# Patient Record
Sex: Female | Born: 1960 | Race: White | Hispanic: No | Marital: Married | Smoking: Never smoker
Health system: Southern US, Community
[De-identification: ages and names within clinical notes are randomized; demographics above are authoritative.]

## PROBLEM LIST (undated history)

## (undated) DIAGNOSIS — G4733 Obstructive sleep apnea (adult) (pediatric): Secondary | ICD-10-CM

## (undated) DIAGNOSIS — E669 Obesity, unspecified: Secondary | ICD-10-CM

## (undated) DIAGNOSIS — E785 Hyperlipidemia, unspecified: Secondary | ICD-10-CM

## (undated) DIAGNOSIS — R739 Hyperglycemia, unspecified: Secondary | ICD-10-CM

## (undated) DIAGNOSIS — R931 Abnormal findings on diagnostic imaging of heart and coronary circulation: Secondary | ICD-10-CM

## (undated) DIAGNOSIS — G4730 Sleep apnea, unspecified: Secondary | ICD-10-CM

## (undated) HISTORY — DX: Sleep apnea, unspecified: G47.30

## (undated) HISTORY — DX: Abnormal findings on diagnostic imaging of heart and coronary circulation: R93.1

## (undated) HISTORY — DX: Hyperlipidemia, unspecified: E78.5

## (undated) HISTORY — DX: Obesity, unspecified: E66.9

## (undated) HISTORY — DX: Hyperglycemia, unspecified: R73.9

## (undated) HISTORY — PX: OTHER SURGICAL HISTORY: SHX169

## (undated) HISTORY — DX: Obstructive sleep apnea (adult) (pediatric): G47.33

---

## 2017-02-17 NOTE — Progress Notes (Signed)
Cardiology Office Note   Date:  02/20/2017   ID:  Hailey Sexton, DOB 1960/12/07, MRN [id number]  PCP:  Patient, No Pcp Per  Cardiologist:   No primary care provider on file. Referring:  None  Chief Complaint  Patient presents with  . Chest Pain      History of Present Illness: Hailey Sexton is a 57 y.o. female who presents for evaluation of chest pain and shortness of breath.  She has no past cardiac history although her mother died in 12/24/2022 following bypass surgery.  Her mother was in her 35s but is otherwise been relatively well.  She had peripheral vascular problems since she was in her 66s.  Her brother who was healthy actually was found recently to have coronary disease in his early 82s and he required bypass.  She had a treadmill test years ago but never had any heart problems by herself.  However, she has been noticing some episodes of chest discomfort.  She feels some pins pricking in her chest at times.  She might notice this with activity such as walking up an incline or carrying something and walking.  Is not completely reproducible and is somewhat vague.  She might have this at rest.  She also might have some shortness of breath where she feels like she cannot get a good breath.  She denies any neck or arm discomfort.  She is not having any palpitations, presyncope or syncope.  She is not having any edema.  However, she is gained 30 pounds over time and she does think that she is deconditioned.  She lives in [place] and works there but her husband lives in [place] and they travel back and forth.  Apparently life in [place] is somewhat distressing for her.   PMH:  None  PSH: Sphincterotomy   Current Outpatient Medications  Medication Sig Dispense Refill  . diazepam (VALIUM) 5 MG tablet As needed     No current facility-administered medications for this visit.     Allergies:   Patient has no known allergies.    Social History:  The patient  reports that  has  never smoked. she has never used smokeless tobacco.   Family History:  The patient's family history includes Heart attack in her maternal grandfather; Heart attack (age of onset: 28) in her father; Heart disease in her mother; Other in her brother; Peripheral Artery Disease (age of onset: 66) in her mother; Stroke in her maternal grandmother.   (See above)   ROS:  Please see the history of present illness.   Otherwise, review of systems are positive for none.   All other systems are reviewed and negative.    PHYSICAL EXAM: VS:  BP (!) 148/102   Pulse 76   Ht 5\' 2"  (1.575 m)   Wt 213 lb (96.6 kg)   BMI 38.96 kg/m  , BMI Body mass index is 38.96 kg/m. GENERAL:  Well appearing HEENT:  Pupils equal round and reactive, fundi not visualized, oral mucosa unremarkable NECK:  No jugular venous distention, waveform within normal limits, carotid upstroke brisk and symmetric, no bruits, no thyromegaly LYMPHATICS:  No cervical, inguinal adenopathy LUNGS:  Clear to auscultation bilaterally BACK:  No CVA tenderness CHEST:  Unremarkable HEART:  PMI not displaced or sustained,S1 and S2 within normal limits, no S3, no S4, no clicks, no rubs, no murmurs ABD:  Flat, positive bowel sounds normal in frequency in pitch, no bruits, no rebound, no guarding, no midline  pulsatile mass, no hepatomegaly, no splenomegaly EXT:  2 plus pulses throughout, no edema, no cyanosis no clubbing SKIN:  No rashes no nodules NEURO:  Cranial nerves II through XII grossly intact, motor grossly intact throughout PSYCH:  Cognitively intact, oriented to person place and time    EKG:  EKG is ordered today. The ekg ordered today demonstrates sinus rhythm, rate 76, axis within normal limits, intervals within normal limits, no acute ST-T wave changes.   Recent Labs: No results found for requested labs within last 8760 hours.    Lipid Panel No results found for: CHOL, TRIG, HDL, CHOLHDL, VLDL, LDLCALC, LDLDIRECT    Wt  Readings from Last 3 Encounters:  02/20/17 213 lb (96.6 kg)      Other studies Reviewed: Additional studies/ records that were reviewed today include: None. Review of the above records demonstrates:  Please see elsewhere in the note.     ASSESSMENT AND PLAN:  CHEST PAIN: The pain is somewhat atypical but she has a significant family history.  I am going to order a coronary calcium score. I will bring the patient back for a POET (Plain Old Exercise Test). This will allow me to screen for obstructive coronary disease, risk stratify and very importantly provide a prescription for exercise.  SOB: She does describe some breathlessness.  I am going to check a CBC.  RISK REDUCTI ON:  She needs screening with a basic metabolic profile and A1c.  I will check a lipid profile.    Current medicines are reviewed at length with the patient today.  The patient does not have concerns regarding medicines.  The following changes have been made:  no change  Labs/ tests ordered today include:   Orders Placed This Encounter  Procedures  . CT CARDIAC SCORING  . CBC  . Basic metabolic panel  . Lipid panel  . HgB A1c  . Exercise Tolerance Test  . EKG 12-Lead     Disposition:   FU with based on the results of the bove.     Signed, Rollene RotundaJames Hicks Feick, MD  02/20/2017 10:41 AM    [facility]

## 2017-02-20 ENCOUNTER — Ambulatory Visit (INDEPENDENT_AMBULATORY_CARE_PROVIDER_SITE_OTHER): Payer: 59 | Admitting: Cardiology

## 2017-02-20 ENCOUNTER — Ambulatory Visit
Admission: RE | Admit: 2017-02-20 | Discharge: 2017-02-20 | Disposition: A | Discharge status: Home / Self Care | Payer: Self-pay | Source: Ambulatory Visit | Attending: Cardiology | Admitting: Cardiology

## 2017-02-20 ENCOUNTER — Encounter: Payer: Self-pay | Admitting: Cardiology

## 2017-02-20 VITALS — BP 148/102 | HR 76 | Ht 62.0 in | Wt 213.0 lb

## 2017-02-20 DIAGNOSIS — Z131 Encounter for screening for diabetes mellitus: Secondary | ICD-10-CM

## 2017-02-20 DIAGNOSIS — Z13220 Encounter for screening for lipoid disorders: Secondary | ICD-10-CM | POA: Diagnosis not present

## 2017-02-20 DIAGNOSIS — R079 Chest pain, unspecified: Secondary | ICD-10-CM

## 2017-02-20 DIAGNOSIS — R0602 Shortness of breath: Secondary | ICD-10-CM

## 2017-02-20 DIAGNOSIS — Z79899 Other long term (current) drug therapy: Secondary | ICD-10-CM | POA: Diagnosis not present

## 2017-02-20 NOTE — Patient Instructions (Signed)
Medication Instructions:  Continue current medications  If you need a refill on your cardiac medications before your next appointment, please call your pharmacy.  Labwork: CBC, BMP, Fasting Lipids and Hgb A1C HERE IN OUR OFFICE AT [facility]  Take the provided lab slips for you to take with you to the lab for you blood draw.   You will need to fast. DO NOT EAT OR DRINK PAST MIDNIGHT.   You may go to any [facility] lab that is convenient for you however, we do have a lab in our office that is able to assist you. You do NOT need an appointment for our lab. Once in our office lobby there is a podium to the right of the check-in desk where you are to sign-in and ring a doorbell to alert us you are here. Lab is open Monday-Friday from 8:00am to 4:00pm; and is closed for lunch from 12:45p-1:45pm   Testing/Procedures: Your physician has requested that you have an exercise tolerance test. For further information please visit [email or web address]. Please also follow instruction sheet, as given.  Your physician has requested that you have a Coronary Calcium Score Test. This test is done at our [facility] Office.  Follow-Up: Your physician wants you to follow-up in: After Test.    Thank you for choosing [facility] at [facility]!!

## 2017-02-21 ENCOUNTER — Ambulatory Visit (HOSPITAL_COMMUNITY)
Admission: RE | Admit: 2017-02-21 | Discharge: 2017-02-21 | Disposition: A | Discharge status: Home / Self Care | Payer: 59 | Source: Ambulatory Visit | Attending: Cardiovascular Disease | Admitting: Cardiovascular Disease

## 2017-02-21 DIAGNOSIS — R079 Chest pain, unspecified: Secondary | ICD-10-CM | POA: Diagnosis not present

## 2017-02-21 DIAGNOSIS — R0602 Shortness of breath: Secondary | ICD-10-CM | POA: Diagnosis not present

## 2017-02-21 LAB — EXERCISE TOLERANCE TEST
CHL CUP MPHR: 165 {beats}/min
CHL CUP RESTING HR STRESS: 74 {beats}/min
CSEPEDS: 11 s
CSEPEW: 8.8 METS
CSEPPHR: 162 {beats}/min
Exercise duration (min): 7 min
Percent HR: 98 %
RPE: 18

## 2017-02-21 LAB — BASIC METABOLIC PANEL
BUN / CREAT RATIO: 22 (ref 9–23)
BUN: 15 mg/dL (ref 6–24)
CHLORIDE: 105 mmol/L (ref 96–106)
CO2: 20 mmol/L (ref 20–29)
Calcium: 9.7 mg/dL (ref 8.7–10.2)
Creatinine, Ser: 0.67 mg/dL (ref 0.57–1.00)
GFR calc Af Amer: 114 mL/min/{1.73_m2} (ref >59)
GFR calc non Af Amer: 99 mL/min/{1.73_m2} (ref >59)
GLUCOSE: 109 mg/dL — AB (ref 65–99)
POTASSIUM: 4.6 mmol/L (ref 3.5–5.2)
SODIUM: 143 mmol/L (ref 134–144)

## 2017-02-21 LAB — CBC
Hematocrit: 41.2 % (ref 34.0–46.6)
Hemoglobin: 13.8 g/dL (ref 11.1–15.9)
MCH: 27.3 pg (ref 26.6–33.0)
MCHC: 33.5 g/dL (ref 31.5–35.7)
MCV: 81 fL (ref 79–97)
PLATELETS: 279 10*3/uL (ref 150–379)
RBC: 5.06 x10E6/uL (ref 3.77–5.28)
RDW: 15.3 % (ref 12.3–15.4)
WBC: 6.5 10*3/uL (ref 3.4–10.8)

## 2017-02-21 LAB — LIPID PANEL
Chol/HDL Ratio: 3.2 ratio (ref 0.0–4.4)
Cholesterol, Total: 251 mg/dL — ABNORMAL HIGH (ref 100–199)
HDL: 78 mg/dL (ref >39)
LDL Calculated: 135 mg/dL — ABNORMAL HIGH (ref 0–99)
Triglycerides: 191 mg/dL — ABNORMAL HIGH (ref 0–149)
VLDL Cholesterol Cal: 38 mg/dL (ref 5–40)

## 2017-02-21 LAB — HEMOGLOBIN A1C
ESTIMATED AVERAGE GLUCOSE: 120 mg/dL
Hgb A1c MFr Bld: 5.8 % — ABNORMAL HIGH (ref 4.8–5.6)

## 2017-02-22 NOTE — Progress Notes (Signed)
Cardiology Office Note   Date:  02/24/2017   ID:  [name], DOB Aug 17, 1960, MRN [id number]  PCP:  Patient, No Pcp Per  Cardiologist:   No primary care provider on file. Referring:  None  Chief Complaint  Patient presents with  . Chest Pain      History of Present Illness: Hailey Sexton is a 57 y.o. female who presents for evaluation of chest pain and shortness of breath.  I sent her for a coronary calcium score and she did have mild coronary calcium.  POET (Plain Old Exercise Treadmill) demonstrated no ischemia.  Lipid profile is listed below.  She presents to review these results.  She did have a poor exercise tolerance on her poet and was significantly short of breath with this.  He continues to have symptoms as described previously.  She has significant shortness of breath with activity.  She feels pinpricking chest discomfort with activity such as walking up an incline with or without carrying something.  He thinks this is been slowly getting worse.  Is not having any new palpitations, presyncope or syncope.  She not having any new PND or orthopnea.   PMH:  Coronary calcium  PSH: Sphincterotomy   Current Outpatient Medications  Medication Sig Dispense Refill  . diazepam (VALIUM) 5 MG tablet As needed    . metoprolol tartrate (LOPRESSOR) 50 MG tablet Take 1 tablet (50 mg total) by mouth once for 1 dose. 1 hour before test 1 tablet 0   No current facility-administered medications for this visit.     Allergies:   Patient has no known allergies.    ROS:  Please see the history of present illness.   Otherwise, review of systems are positive for tearfulness and stress.   All other systems are reviewed and negative.    PHYSICAL EXAM: VS:  BP 130/88   Pulse 84   Ht 5\' 2"  (1.575 m)   Wt 210 lb (95.3 kg)   BMI 38.41 kg/m  , BMI Body mass index is 38.41 kg/m.  GENERAL:  Well appearing NECK:  No jugular venous distention, waveform within normal limits, carotid upstroke  brisk and symmetric, no bruits, no thyromegaly LUNGS:  Clear to auscultation bilaterally CHEST:  Unremarkable HEART:  PMI not displaced or sustained,S1 and S2 within normal limits, no S3, no S4, no clicks, no rubs, no murmurs ABD:  Flat, positive bowel sounds normal in frequency in pitch, no bruits, no rebound, no guarding, no midline pulsatile mass, no hepatomegaly, no splenomegaly EXT:  2 plus pulses throughout, no edema, no cyanosis no clubbing PSYCH:  Tearful   EKG:  EKG is not ordered today.    Recent Labs: 02/20/2017: BUN 15; Creatinine, Ser 0.67; Hemoglobin 13.8; Platelets 279; Potassium 4.6; Sodium 143    Lipid Panel    Component Value Date/Time   CHOL 251 (H) 02/20/2017 1045   TRIG 191 (H) 02/20/2017 1045   HDL 78 02/20/2017 1045   CHOLHDL 3.2 02/20/2017 1045   LDLCALC 135 (H) 02/20/2017 1045      Wt Readings from Last 3 Encounters:  02/24/17 210 lb (95.3 kg)  02/20/17 213 lb (96.6 kg)      Other studies Reviewed: Additional studies/ records that were reviewed today include: POET (Plain Old Exercise Treadmill), calcium score and lipids. Review of the above records demonstrates:      ASSESSMENT AND PLAN:  CHEST PAIN:   Given the ongoing pain and dyspnea and the poor exercise tolerance screening with  further testing is indicated.  I will order a CTA.  With her family history and positive coronary calcium I think the risk of obstructive coronary disease is high   SOB:  This will be evaluated as above.  DYSLIPIDEMIA:  Her MESA score was 2.3%.  Based on this she is not me to statin but needs lifestyle and diet changes.  HYPERGLYCEMIA: Her A1c was elevated.  She needs diet control and weight loss.  We talked about this.  DEPRESSION: She is grieving over the death of her mother.  She needs medical therapy along with her grief counseling.  Current medicines are reviewed at length with the patient today.  The patient does not have concerns regarding medicines.  The  following changes have been made:  no change  Labs/ tests ordered today include:   Orders Placed This Encounter  Procedures  . CT CORONARY MORPH W/CTA COR W/SCORE W/CA W/CM &/OR WO/CM  . CT CORONARY FRACTIONAL FLOW RESERVE DATA PREP  . CT CORONARY FRACTIONAL FLOW RESERVE FLUID ANALYSIS  . Basic Metabolic Panel (BMET)     Disposition:   FU with me based on the results of the above.     Signed, Rollene RotundaJames Jenell Dobransky, MD  02/24/2017 1:09 PM    [facility]

## 2017-02-24 ENCOUNTER — Ambulatory Visit (INDEPENDENT_AMBULATORY_CARE_PROVIDER_SITE_OTHER): Payer: 59 | Admitting: Cardiology

## 2017-02-24 ENCOUNTER — Encounter: Payer: Self-pay | Admitting: Cardiology

## 2017-02-24 VITALS — BP 130/88 | HR 84 | Ht 62.0 in | Wt 210.0 lb

## 2017-02-24 DIAGNOSIS — R931 Abnormal findings on diagnostic imaging of heart and coronary circulation: Secondary | ICD-10-CM

## 2017-02-24 DIAGNOSIS — R079 Chest pain, unspecified: Secondary | ICD-10-CM | POA: Diagnosis not present

## 2017-02-24 DIAGNOSIS — E785 Hyperlipidemia, unspecified: Secondary | ICD-10-CM

## 2017-02-24 DIAGNOSIS — R0602 Shortness of breath: Secondary | ICD-10-CM | POA: Diagnosis not present

## 2017-02-24 MED ORDER — METOPROLOL TARTRATE 50 MG PO TABS: 50.0000 mg | ORAL_TABLET | Freq: Once | ORAL | 0 refills | Status: DC

## 2017-02-24 NOTE — Patient Instructions (Addendum)
Medication Instructions:  TAKE- Metoprolol 50 mg 1 hour before your Cardiac CTA  If you need a refill on your cardiac medications before your next appointment, please call your pharmacy.  Labwork: BMP HERE IN OUR OFFICE AT [facility]  Take the provided lab slips for you to take with you to the lab for you blood draw.   You will NOT need to fast   You may go to any [facility] lab that is convenient for you however, we do have a lab in our office that is able to assist you. You do NOT need an appointment for our lab. Once in our office lobby there is a podium to the right of the check-in desk where you are to sign-in and ring a doorbell to alert us you are here. Lab is open Monday-Friday from 8:00am to 4:00pm; and is closed for lunch from 12:45p-1:45pm   Testing/Procedures: Your physician has requested that you have cardiac CT. Cardiac computed tomography (CT) is a painless test that uses an x-ray machine to take clear, detailed pictures of your heart. For further information please visit [email or web address]. Please follow instruction sheet as given.   Please arrive at the [facility] main entrance of [facility] at    AM (30-45 minutes prior to test start time)  [facility] [address] [place], [place] [address] [phone]  Proceed to the [facility] Radiology Department (First Floor).  Please follow these instructions carefully (unless otherwise directed):  Hold all erectile dysfunction medications at least 48 hours prior to test.  On the Night Before the Test: . Drink plenty of water. . Do not consume any caffeinated/decaffeinated beverages or chocolate 12 hours prior to your test. . Do not take any antihistamines 12 hours prior to your test. . If you take Metformin do not take 24 hours prior to test.  On the Day of the Test: . Drink plenty of water. Do not drink any water within one hour of the test. . Do not eat any food 4 hours prior to the test. . You  may take your regular medications prior to the test. . IF NOT ON A BETA BLOCKER - Take 50 mg of lopressor (metoprolol) one hour before the test. . HOLD Furosemide morning of the test.  After the Test: . Drink plenty of water. . After receiving IV contrast, you may experience a mild flushed feeling. This is normal. . On occasion, you may experience a mild rash up to 24 hours after the test. This is not dangerous. If this occurs, you can take Benadryl 25 mg and increase your fluid intake. . If you experience trouble breathing, this can be serious. If it is severe call 911 IMMEDIATELY. If it is mild, please call our office. . If you take any of these medications: Glipizide/Metformin, Avandament, Glucavance, please do not take 48 hours after completing test.  Follow-Up: Your physician wants you to follow-up in: After Test.    Thank you for choosing [facility] at [facility]!!

## 2017-04-07 ENCOUNTER — Ambulatory Visit (HOSPITAL_COMMUNITY)
Admission: RE | Admit: 2017-04-07 | Discharge: 2017-04-07 | Disposition: A | Discharge status: Home / Self Care | Payer: 59 | Source: Ambulatory Visit | Attending: Cardiology | Admitting: Cardiology

## 2017-04-07 ENCOUNTER — Encounter (HOSPITAL_COMMUNITY): Payer: Self-pay

## 2017-04-07 DIAGNOSIS — R079 Chest pain, unspecified: Secondary | ICD-10-CM

## 2017-04-07 DIAGNOSIS — R0602 Shortness of breath: Secondary | ICD-10-CM | POA: Diagnosis present

## 2017-04-07 DIAGNOSIS — R062 Wheezing: Secondary | ICD-10-CM | POA: Insufficient documentation

## 2017-04-07 MED ORDER — NITROGLYCERIN 0.4 MG SL SUBL
SUBLINGUAL_TABLET | SUBLINGUAL | Status: AC
Start: 2017-04-07 — End: 2017-04-07
  Filled 2017-04-07: qty 2

## 2017-04-07 MED ORDER — IOPAMIDOL (ISOVUE-370) INJECTION 76%
INTRAVENOUS | Status: AC
  Administered 2017-04-07: 80 mL
  Filled 2017-04-07: qty 100

## 2017-04-13 ENCOUNTER — Telehealth: Payer: Self-pay | Admitting: *Deleted

## 2017-04-13 DIAGNOSIS — E785 Hyperlipidemia, unspecified: Secondary | ICD-10-CM

## 2017-04-13 MED ORDER — ATORVASTATIN CALCIUM 40 MG PO TABS: 40.0000 mg | ORAL_TABLET | Freq: Every day | ORAL | 3 refills | Status: DC

## 2017-04-13 NOTE — Telephone Encounter (Signed)
Rx has been sent to the pharmacy electronically. Atorvastatin 40 mg # 90 3 refills Fasting lipids ordered and mailed to pt

## 2017-04-13 NOTE — Telephone Encounter (Signed)
-----   Message from Rollene RotundaJames Hochrein, MD sent at 04/10/2017 12:29 PM EST ----- I called her with the results of the CT.  She needs continued risk reduction.  She is interested in taking a statin.  I think that this is reasonable.  She also understands that she absolutely needs diet and exercise.  Please call in Lipitor 40 mg once po daily.  Disp number 90 with three refills.  Call [facility] into [facility] [address] [place].  [facility] and needs a profile in about 10 weeks with lipid and liver fasting.  I will consider doing a POET (Plain Old Exercise Treadmill) in one year.

## 2021-01-14 NOTE — Progress Notes (Signed)
Cardiology Office Note   Date:  01/15/2021   ID:  Hailey Sexton, DOB [birth year], MRN [id number]  PCP:  Patient, No Pcp Per (Inactive)  Cardiologist:   None Referring:  None  Chief Complaint  Patient presents with   Shortness of Breath       History of Present Illness: Hailey Sexton is a 60 y.o. female who presents for evaluation of chest pain and shortness of breath.  In 2019 I sent her for for a coronary CT and she was found to have very mild plaque with a minimal coronary calcium.    She comes back for routine follow-up.  She said at that time she was in a dark place because her mother died.  She lost some weight and started exercising.  However, when COVID hit she gained a lot of weight back and more.  He stopped being as active.  She is now back to swimming.  She does get a little short of breath with activity but she is not having any new chest pressure, neck or arm discomfort.  She is not having any new presyncope or syncope.  She will occasionally have some skipping heartbeats.   PMH:  Coronary calcium, dyslipidemia  PSH: Sphincterotomy   Current Outpatient Medications  Medication Sig Dispense Refill   atorvastatin (LIPITOR) 40 MG tablet Take 1 tablet (40 mg total) by mouth daily. 90 tablet 3   diazepam (VALIUM) 5 MG tablet As needed     metoprolol tartrate (LOPRESSOR) 50 MG tablet Take 1 tablet (50 mg total) by mouth once for 1 dose. 1 hour before test 1 tablet 0   No current facility-administered medications for this visit.    Allergies:   Patient has no known allergies.    ROS:  Please see the history of present illness.   Otherwise, review of systems are positive for none.   All other systems are reviewed and negative.    PHYSICAL EXAM: VS:  BP 136/86   Pulse 73   Ht 5\' 2"  (1.575 m)   Wt 204 lb (92.5 kg)   SpO2 96%   BMI 37.31 kg/m  , BMI Body mass index is 37.31 kg/m.  GENERAL:  Well appearing NECK:  No jugular venous distention, waveform within  normal limits, carotid upstroke brisk and symmetric, no bruits, no thyromegaly LUNGS:  Clear to auscultation bilaterally CHEST:  Unremarkable HEART:  PMI not displaced or sustained,S1 and S2 within normal limits, no S3, no S4, no clicks, no rubs, no murmurs ABD:  Flat, positive bowel sounds normal in frequency in pitch, no bruits, no rebound, no guarding, no midline pulsatile mass, no hepatomegaly, no splenomegaly EXT:  2 plus pulses throughout, no edema, no cyanosis no clubbing   EKG:  EKG is  ordered today. Normal sinus rhythm, rate 73, axis within normal limits, intervals within normal limits, no acute ST-T wave changes.   Recent Labs: No results found for requested labs within last 8760 hours.    Lipid Panel    Component Value Date/Time   CHOL 251 (H) 02/20/2017 1045   TRIG 191 (H) 02/20/2017 1045   HDL 78 02/20/2017 1045   CHOLHDL 3.2 02/20/2017 1045   LDLCALC 135 (H) 02/20/2017 1045      Wt Readings from Last 3 Encounters:  01/15/21 204 lb (92.5 kg)  02/24/17 210 lb (95.3 kg)  02/20/17 213 lb (96.6 kg)      Other studies Reviewed: Additional studies/ records that were reviewed today include: Labs  Review of the above records demonstrates:   See below   ASSESSMENT AND PLAN:  ELEVATED CORONARY CALCIUM:   She has a strong family history.  She would like another coronary calcium score which is not unreasonable as it would probably help her decide to take statin which she has not been doing.   SOB: Her breathing seems to be okay although there are times when she is short of breath.  She is very worried about obstructive disease which I think is a low pretest probability.  We agreed that she would start an exercise regimen and weight loss and if in 3 to 6 months she is getting more short of breath rather than less I would consider repeat CT angiography.   DYSLIPIDEMIA:  Her MESA score was 2.3%.  I am going to repeat this.  She is going to find her most recent lipids.   Goals of therapy will be based on these 2 findings.   HYPERGLYCEMIA: Her A1c was 5.6.  No change in therapy.    Current medicines are reviewed at length with the patient today.  The patient does not have concerns regarding medicines.  The following changes have been made:  None  Labs/ tests ordered today include:   Orders Placed This Encounter  Procedures   CT CARDIAC SCORING (SELF PAY ONLY)      Disposition:   FU with me in 12 months.     Signed, Rollene Rotunda, MD  01/15/2021 9:18 AM    [facility]

## 2021-01-15 ENCOUNTER — Encounter: Payer: Self-pay | Admitting: Cardiology

## 2021-01-15 ENCOUNTER — Other Ambulatory Visit: Payer: Self-pay

## 2021-01-15 ENCOUNTER — Ambulatory Visit (INDEPENDENT_AMBULATORY_CARE_PROVIDER_SITE_OTHER): Payer: 59 | Admitting: Cardiology

## 2021-01-15 VITALS — BP 136/86 | HR 73 | Ht 62.0 in | Wt 204.0 lb

## 2021-01-15 DIAGNOSIS — E785 Hyperlipidemia, unspecified: Secondary | ICD-10-CM | POA: Diagnosis not present

## 2021-01-15 DIAGNOSIS — R931 Abnormal findings on diagnostic imaging of heart and coronary circulation: Secondary | ICD-10-CM | POA: Diagnosis not present

## 2021-01-15 DIAGNOSIS — R0602 Shortness of breath: Secondary | ICD-10-CM | POA: Diagnosis not present

## 2021-01-15 NOTE — Patient Instructions (Addendum)
Medication Instructions:  Your Physician recommend you continue on your current medication as directed.    *If you need a refill on your cardiac medications before your next appointment, please call your pharmacy*   Testing/Procedures: Dr. Antoine Poche, MD has ordered a CT coronary calcium score. This test is done at [address]. This is $99 out of pocket.  Coronary CalciumScan A coronary calcium scan is an imaging test used to look for deposits of calcium and other fatty materials (plaques) in the inner lining of the blood vessels of the heart (coronary arteries). These deposits of calcium and plaques can partly clog and narrow the coronary arteries without producing any symptoms or warning signs. This puts a person at risk for a heart attack. This test can detect these deposits before symptoms develop. Tell a health care provider about: Any allergies you have. All medicines you are taking, including vitamins, herbs, eye drops, creams, and over-the-counter medicines. Any problems you or family members have had with anesthetic medicines. Any blood disorders you have. Any surgeries you have had. Any medical conditions you have. Whether you are pregnant or may be pregnant. What are the risks? Generally, this is a safe procedure. However, problems may occur, including: Harm to a pregnant woman and her unborn baby. This test involves the use of radiation. Radiation exposure can be dangerous to a pregnant woman and her unborn baby. If you are pregnant, you generally should not have this procedure done. Slight increase in the risk of cancer. This is because of the radiation involved in the test. What happens before the procedure? No preparation is needed for this procedure. What happens during the procedure? You will undress and remove any jewelry around your neck or chest. You will put on a hospital gown. Sticky electrodes will be placed on your chest. The electrodes will be  connected to an electrocardiogram (ECG) machine to record a tracing of the electrical activity of your heart. A CT scanner will take pictures of your heart. During this time, you will be asked to lie still and hold your breath for 2-3 seconds while a picture of your heart is being taken. The procedure may vary among health care providers and hospitals. What happens after the procedure? You can get dressed. You can return to your normal activities. It is up to you to get the results of your test. Ask your health care provider, or the department that is doing the test, when your results will be ready. Summary A coronary calcium scan is an imaging test used to look for deposits of calcium and other fatty materials (plaques) in the inner lining of the blood vessels of the heart (coronary arteries). Generally, this is a safe procedure. Tell your health care provider if you are pregnant or may be pregnant. No preparation is needed for this procedure. A CT scanner will take pictures of your heart. You can return to your normal activities after the scan is done. This information is not intended to replace advice given to you by your health care provider. Make sure you discuss any questions you have with your health care provider. Document Released: 07/23/2007 Document Revised: 12/14/2015 Document Reviewed: 12/14/2015 Elsevier Interactive Patient Education  2017 [employer].  Follow-Up: At [facility], you and your health needs are our priority.  As part of our continuing mission to provide you with exceptional heart care, we have created designated Provider Care Teams.  These Care Teams include your primary Cardiologist (physician) and  Advanced Practice Providers (APPs -  Physician Assistants and Nurse Practitioners) who all work together to provide you with the care you need, when you need it.  We recommend signing up for the patient portal called "MyChart".  Sign up information is provided on this  After Visit Summary.  MyChart is used to connect with patients for Virtual Visits (Telemedicine).  Patients are able to view lab/test results, encounter notes, upcoming appointments, etc.  Non-urgent messages can be sent to your provider as well.   To learn more about what you can do with MyChart, go to [email or web address].    Your next appointment:   12 month(s)  The format for your next appointment:   In Person  Provider:   Dr. Daiva Nakayama, MD     Remember to call our office about your breathing.

## 2021-01-18 ENCOUNTER — Telehealth: Payer: Self-pay | Admitting: Cardiology

## 2021-01-18 NOTE — Telephone Encounter (Signed)
Hailey Sexton is calling stating she received a call from the office this morning. Unable to find documentation in regards to what she was called for. Please advise.

## 2021-01-18 NOTE — Telephone Encounter (Signed)
Spoke with the patient who states that she received a call this morning but no message was left. She states that she did have er PCP fax over lab results to Dr. Antoine Poche and she would like to know if he has reviewed those yet.

## 2021-01-18 NOTE — Telephone Encounter (Signed)
Unable to reach pt or leave a message  

## 2021-01-19 NOTE — Addendum Note (Signed)
Addended by: Brunetta Genera on: 01/19/2021 03:58 PM   Modules accepted: Orders

## 2021-01-20 NOTE — Telephone Encounter (Signed)
Unable to reach pt or leave a message mailbox is full Dr Antoine Poche has reviewed her lab work and is making no changes based on the results.

## 2021-02-19 ENCOUNTER — Ambulatory Visit (INDEPENDENT_AMBULATORY_CARE_PROVIDER_SITE_OTHER)
Admission: RE | Admit: 2021-02-19 | Discharge: 2021-02-19 | Disposition: A | Discharge status: Home / Self Care | Payer: 59 | Source: Ambulatory Visit | Attending: Cardiology | Admitting: Cardiology

## 2021-02-19 ENCOUNTER — Other Ambulatory Visit: Payer: Self-pay

## 2021-02-19 DIAGNOSIS — R931 Abnormal findings on diagnostic imaging of heart and coronary circulation: Secondary | ICD-10-CM

## 2021-02-19 DIAGNOSIS — E785 Hyperlipidemia, unspecified: Secondary | ICD-10-CM

## 2021-02-19 DIAGNOSIS — R0602 Shortness of breath: Secondary | ICD-10-CM

## 2021-02-26 ENCOUNTER — Telehealth: Payer: Self-pay

## 2021-02-26 DIAGNOSIS — R931 Abnormal findings on diagnostic imaging of heart and coronary circulation: Secondary | ICD-10-CM

## 2021-02-26 DIAGNOSIS — E785 Hyperlipidemia, unspecified: Secondary | ICD-10-CM

## 2021-02-26 MED ORDER — ATORVASTATIN CALCIUM 40 MG PO TABS: 40.0000 mg | ORAL_TABLET | Freq: Every day | ORAL | 3 refills | Status: DC

## 2021-02-26 NOTE — Telephone Encounter (Signed)
Spoke to patient coronary calcium score results given.Dr.Hochrein advised to restart Lipitor 40 mg daily.Repeat fasting lipid panel in 10 weeks.Lab order mailed.Stated she will be in [place] then.She will have Lipid panel done at Dr.Hochrein's office.

## 2021-03-03 NOTE — Telephone Encounter (Signed)
Opened in error

## 2021-03-04 ENCOUNTER — Other Ambulatory Visit: Payer: Self-pay

## 2021-03-04 DIAGNOSIS — E785 Hyperlipidemia, unspecified: Secondary | ICD-10-CM

## 2021-03-04 NOTE — Progress Notes (Signed)
id 

## 2021-11-15 ENCOUNTER — Telehealth: Payer: Self-pay | Admitting: Cardiology

## 2021-11-15 DIAGNOSIS — R0602 Shortness of breath: Secondary | ICD-10-CM

## 2021-11-15 DIAGNOSIS — R931 Abnormal findings on diagnostic imaging of heart and coronary circulation: Secondary | ICD-10-CM

## 2021-11-15 NOTE — Telephone Encounter (Signed)
Called patient unable to leave a message mail box is full.

## 2021-11-15 NOTE — Telephone Encounter (Signed)
Pt wants a calcium scoring test before or on her appt on the 12/02/21.

## 2021-11-15 NOTE — Telephone Encounter (Signed)
Spoke to patient she wanted to have a repeat calcium score same day she sees Dr.Hochrein 10/26.Stated she  wants to see how she is doing.I will send message to Dr.Hochrein for a order.

## 2021-11-16 NOTE — Telephone Encounter (Signed)
Spoke to patient Dr.Hochrein ordered a coronary calcium score.Appointment scheduled 10/26 at 11:30 am Arrive at 11:15 am at [facility].

## 2021-11-16 NOTE — Telephone Encounter (Signed)
Spoke to patient Dr.Hochrein's advice given.Stated she does not understand why she cannot have calcium score done the same day as her appointment with Dr.Hochrein.Stated she pays for test and she is coming from [place] will save her a lot of time.Stated she wants to make sure calcium score does not go up any more.She wanted to ask Dr.Hochrein again if he would order.Message sent to [place].

## 2021-11-29 DIAGNOSIS — R739 Hyperglycemia, unspecified: Secondary | ICD-10-CM | POA: Insufficient documentation

## 2021-11-29 NOTE — Progress Notes (Unsigned)
Cardiology Office Note   Date:  12/02/2021   ID:  [name], DOB Jun 05, 1960, MRN [id number]  PCP:  Patient, No Pcp Per  Cardiologist:   None Referring:  None  Chief Complaint  Patient presents with   Chest Pain       History of Present Illness: Hailey Sexton is a 61 y.o. female who presents for evaluation of chest pain and shortness of breath.  In 2019 I sent her for for a coronary CT and she was found to have very mild plaque with a minimal coronary calcium.  She was in the ED in Sept with chest pain.  This was in [facility].  I reviewed these records for this visit.   Troponin was negative.  There were no acute EKG changes.  She did however, leave the ED without being seen by the provider.  She actually has no better.  She is lost about 20 pounds.  She has been climbing the stairs without getting short of breath.  She has not been having any new chest pressure, neck or arm discomfort.  She had no new palpitations, presyncope or syncope.  PMH:  Coronary calcium, dyslipidemia  PSH: Sphincterotomy   Current Outpatient Medications  Medication Sig Dispense Refill   diazepam (VALIUM) 5 MG tablet As needed     rosuvastatin (CRESTOR) 20 MG tablet Take 1 tablet (20 mg total) by mouth daily. 90 tablet 3   No current facility-administered medications for this visit.    Allergies:   Patient has no known allergies.    ROS:  Please see the history of present illness.   Otherwise, review of systems are positive for none.   All other systems are reviewed and negative.    PHYSICAL EXAM: VS:  BP 118/80   Pulse 73   Ht 5\' 2"  (1.575 m)   Wt 195 lb 9.6 oz (88.7 kg)   BMI 35.78 kg/m  , BMI Body mass index is 35.78 kg/m.  GENERAL:  Well appearing NECK:  No jugular venous distention, waveform within normal limits, carotid upstroke brisk and symmetric, no bruits, no thyromegaly LUNGS:  Clear to auscultation bilaterally CHEST:  Unremarkable HEART:  PMI not displaced or sustained,S1  and S2 within normal limits, no S3, no S4, no clicks, no rubs, no murmurs ABD:  Flat, positive bowel sounds normal in frequency in pitch, no bruits, no rebound, no guarding, no midline pulsatile mass, no hepatomegaly, no splenomegaly EXT:  2 plus pulses throughout, no edema, no cyanosis no clubbing   EKG:  EKG is  ordered today. Normal sinus rhythm, rate 73, axis within normal limits, intervals within normal limits, no acute ST-T wave changes.   Recent Labs: No results found for requested labs within last 365 days.    Lipid Panel    Component Value Date/Time   CHOL 251 (H) 02/20/2017 1045   TRIG 191 (H) 02/20/2017 1045   HDL 78 02/20/2017 1045   CHOLHDL 3.2 02/20/2017 1045   LDLCALC 135 (H) 02/20/2017 1045      Wt Readings from Last 3 Encounters:  12/02/21 195 lb 9.6 oz (88.7 kg)  01/15/21 204 lb (92.5 kg)  02/24/17 210 lb (95.3 kg)      Other studies Reviewed: Additional studies/ records that were reviewed today include: Labs Review of the above records demonstrates:   See elsewhere   ASSESSMENT AND PLAN:  ELEVATED CORONARY CALCIUM:     She has asked for another coronary calcium score.  She  will have this although I explained that the biggest issue is being aware of symptoms and risk reduction.  She needs better risk reduction.  See below   DYSLIPIDEMIA:   I am going to repeat a lipid profile get an A1c and an LP(a).  She could not take Lipitor so I will switch her to Crestor.  If she cannot tolerate this with her family history I will suggest a PCSK9.   HYPERGLYCEMIA: Her A1c was 6.0.  I will repeat this today.  We talked about a Mediterranean plant-based diet.    Current medicines are reviewed at length with the patient today.  The patient does not have concerns regarding medicines.  The following changes have been made: As above  Labs/ tests ordered today include:   Orders Placed This Encounter  Procedures   Lipid panel   Lipoprotein A (LPA)   Hemoglobin A1c    EKG 12-Lead      Disposition:   FU with me in 12 months.     Signed, Minus Breeding, MD  12/02/2021 8:30 AM    [place]

## 2021-12-02 ENCOUNTER — Ambulatory Visit (INDEPENDENT_AMBULATORY_CARE_PROVIDER_SITE_OTHER): Payer: 59 | Admitting: Cardiology

## 2021-12-02 ENCOUNTER — Encounter: Payer: Self-pay | Admitting: Cardiology

## 2021-12-02 ENCOUNTER — Ambulatory Visit (HOSPITAL_COMMUNITY)
Admission: RE | Admit: 2021-12-02 | Discharge: 2021-12-02 | Disposition: A | Discharge status: Home / Self Care | Payer: 59 | Source: Ambulatory Visit | Attending: Cardiology | Admitting: Cardiology

## 2021-12-02 VITALS — BP 118/80 | HR 73 | Ht 62.0 in | Wt 195.6 lb

## 2021-12-02 DIAGNOSIS — R0602 Shortness of breath: Secondary | ICD-10-CM | POA: Diagnosis not present

## 2021-12-02 DIAGNOSIS — R072 Precordial pain: Secondary | ICD-10-CM | POA: Insufficient documentation

## 2021-12-02 DIAGNOSIS — R739 Hyperglycemia, unspecified: Secondary | ICD-10-CM

## 2021-12-02 DIAGNOSIS — R931 Abnormal findings on diagnostic imaging of heart and coronary circulation: Secondary | ICD-10-CM | POA: Insufficient documentation

## 2021-12-02 DIAGNOSIS — E785 Hyperlipidemia, unspecified: Secondary | ICD-10-CM | POA: Insufficient documentation

## 2021-12-02 MED ORDER — ROSUVASTATIN CALCIUM 20 MG PO TABS: 20.0000 mg | ORAL_TABLET | Freq: Every day | ORAL | 3 refills | Status: DC

## 2021-12-02 NOTE — Patient Instructions (Signed)
Medication Instructions:  Your physician has recommended you make the following change in your medication:   -Stop taking atorvastatin (lipitor).  -Start taking rosuvastatin (crestor) 20mg  once daily.  *If you need a refill on your cardiac medications before your next appointment, please call your pharmacy*   Lab Work: Your physician recommends that you have labs drawn today: Lipids, LPa, & A1C   If you have labs (blood work) drawn today and your tests are completely normal, you will receive your results only by: [place] (if you have MyChart) OR A paper copy in the mail If you have any lab test that is abnormal or we need to change your treatment, we will call you to review the results.   Follow-Up: At [facility], you and your health needs are our priority.  As part of our continuing mission to provide you with exceptional heart care, we have created designated Provider Care Teams.  These Care Teams include your primary Cardiologist (physician) and Advanced Practice Providers (APPs -  Physician Assistants and Nurse Practitioners) who all work together to provide you with the care you need, when you need it.  We recommend signing up for the patient portal called "MyChart".  Sign up information is provided on this After Visit Summary.  MyChart is used to connect with patients for Virtual Visits (Telemedicine).  Patients are able to view lab/test results, encounter notes, upcoming appointments, etc.  Non-urgent messages can be sent to your provider as well.   To learn more about what you can do with MyChart, go to [email or web address].    Your next appointment:   12 month(s)  The format for your next appointment:   In Person  Provider:   Minus Breeding, MD

## 2021-12-03 LAB — LIPID PANEL
Chol/HDL Ratio: 3.8 ratio (ref 0.0–4.4)
Cholesterol, Total: 221 mg/dL — ABNORMAL HIGH (ref 100–199)
HDL: 58 mg/dL (ref >39)
LDL Chol Calc (NIH): 134 mg/dL — ABNORMAL HIGH (ref 0–99)
Triglycerides: 162 mg/dL — ABNORMAL HIGH (ref 0–149)
VLDL Cholesterol Cal: 29 mg/dL (ref 5–40)

## 2021-12-03 LAB — LIPOPROTEIN A (LPA): Lipoprotein (a): 9.8 nmol/L (ref <75.0)

## 2021-12-03 LAB — HEMOGLOBIN A1C
Est. average glucose Bld gHb Est-mCnc: 120 mg/dL
Hgb A1c MFr Bld: 5.8 % — ABNORMAL HIGH (ref 4.8–5.6)

## 2021-12-06 ENCOUNTER — Encounter: Payer: Self-pay | Admitting: *Deleted

## 2021-12-07 ENCOUNTER — Telehealth: Payer: Self-pay | Admitting: Cardiology

## 2021-12-07 NOTE — Telephone Encounter (Signed)
Patient states she is going in for another appointment and requested a call back after 4:15 PM if her call is returned today.

## 2021-12-07 NOTE — Telephone Encounter (Signed)
Spoke to patient lab results given.She will start taking Crestor 20 mg daily this afternoon.She will repeat fasting lipid panel in 3 months.

## 2021-12-07 NOTE — Telephone Encounter (Signed)
Patient is returning call to discuss lab/CT results.

## 2021-12-08 ENCOUNTER — Telehealth: Payer: Self-pay | Admitting: Cardiology

## 2021-12-08 NOTE — Telephone Encounter (Signed)
Patient is following up, requesting to have lab results faxed to Dr. Liliane Channel at fax # [phone] if possible. She states she is on her way into an appointment with him this morning, 10:00 AM.

## 2021-12-08 NOTE — Telephone Encounter (Signed)
Pt calling about fax results again (previous phone note). She states the office hasn't received it, informed her they were sent at 10:30. She confirmed that was the right fax number but that we fax it again.   [phone]

## 2021-12-08 NOTE — Telephone Encounter (Signed)
Results have been faxed

## 2021-12-08 NOTE — Telephone Encounter (Signed)
Results have been refaxed.

## 2022-03-22 ENCOUNTER — Encounter: Payer: Self-pay | Admitting: *Deleted

## 2022-03-22 ENCOUNTER — Other Ambulatory Visit: Payer: Self-pay | Admitting: *Deleted

## 2022-03-22 DIAGNOSIS — E785 Hyperlipidemia, unspecified: Secondary | ICD-10-CM

## 2022-10-22 IMAGING — CT CT CARDIAC CORONARY ARTERY CALCIUM SCORE
3 series · 14 of 20 positions shown, 16 images · non-contrast
Comparison: Cardiac CT 04/07/2017
COMPARISON: Cardiac CT 04/07/2017

Addendum:
EXAM:
OVER-READ INTERPRETATION  CT CHEST

The following report is an over-read performed by radiologist Dr.
Annet Spring [REDACTED] on 02/19/2021. This
over-read does not include interpretation of cardiac or coronary
anatomy or pathology. The coronary calcium score interpretation by
the cardiologist is attached.
CLINICAL DATA: Cardiovascular Disease Risk stratification
Coronary Calcium Score
TECHNIQUE: A gated, non-contrast computed tomography scan of the heart was
performed using 3mm slice thickness. Axial images were analyzed on a
dedicated workstation. Calcium scoring of the coronary arteries was
performed using the Agatston method.

[Series 2: cascseq 2.0 sa36 70% (id) · axial · 0.39mm/px · z∈[-598,-518]mm · 4 of 68 slices shown]
[im 14/68  vessel]
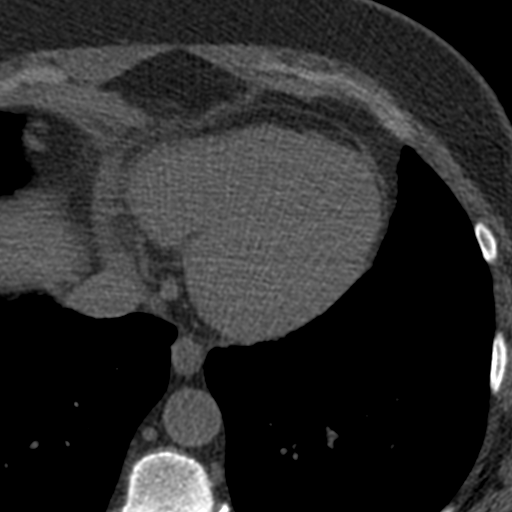
[im 27/68  vessel]
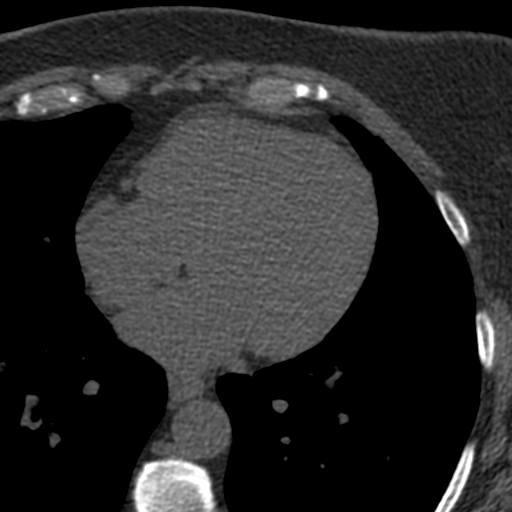
[im 41/68  vessel]
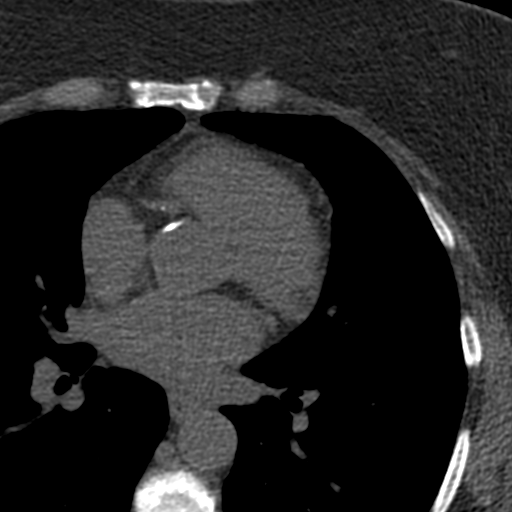
[im 54/68  vessel]
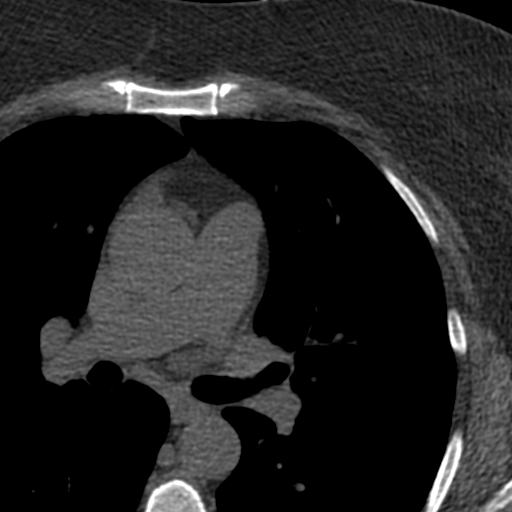

[Series 3: cascseq 2.0 bf37 st · axial · 0.73mm/px · z∈[-602,-514]mm · 5 of 68 slices shown, 7 images]
[im 12/68  vessel]
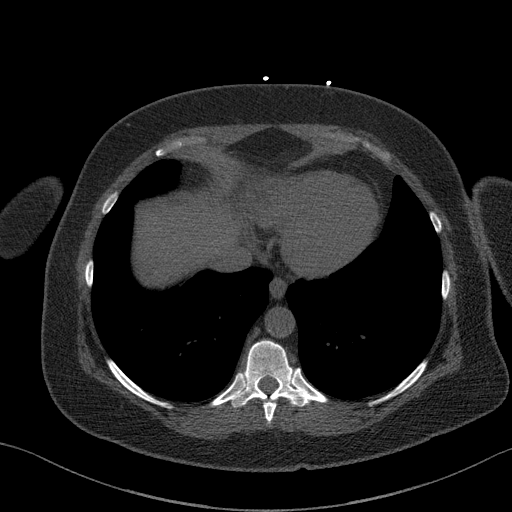
[im 12/68  lung]
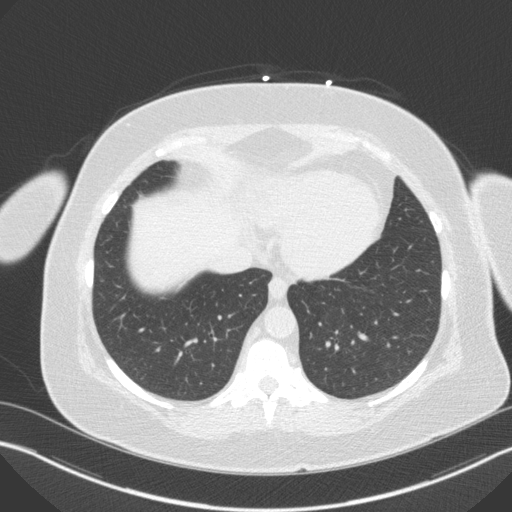
[im 23/68  vessel]
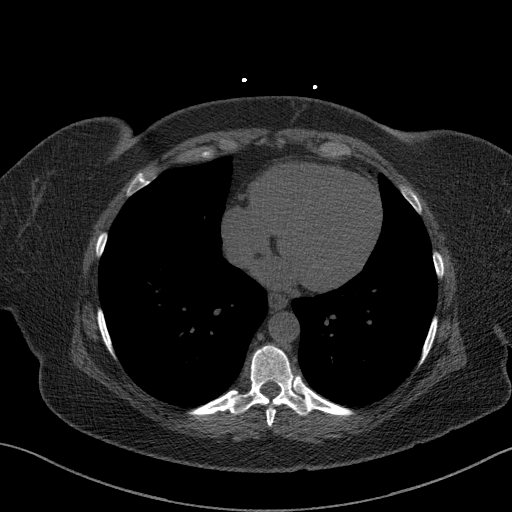
[im 34/68  vessel]
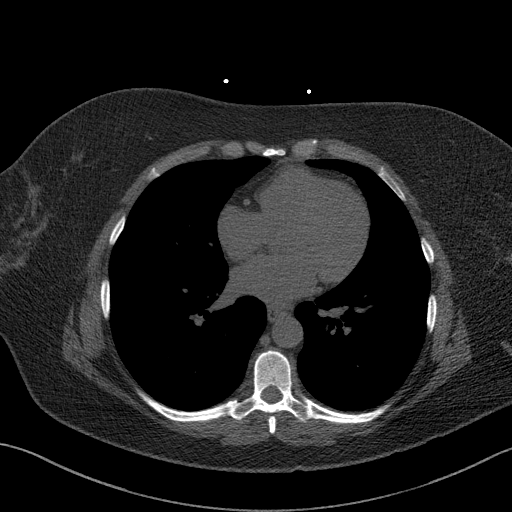
[im 45/68  vessel]
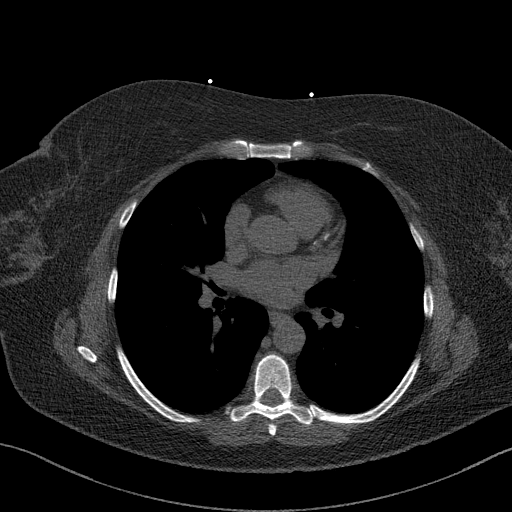
[im 56/68  vessel]
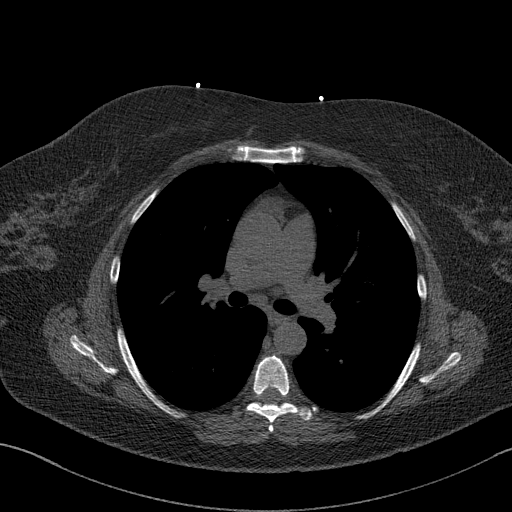
[im 56/68  lung]
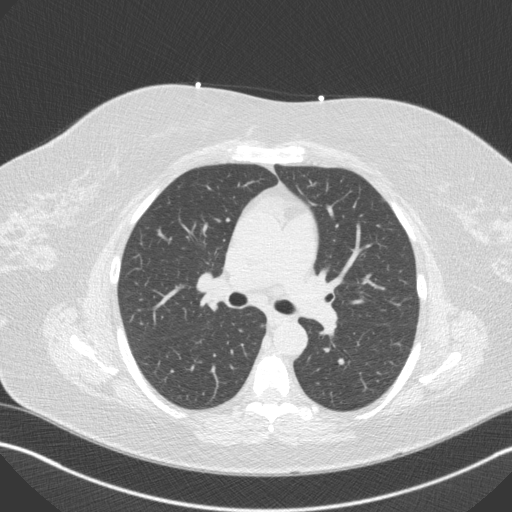

[Series 4: cascseq 2.0 br59 lung · axial · 0.73mm/px · z∈[-602,-514]mm · 5 of 68 slices shown]
[im 12/68  lung]
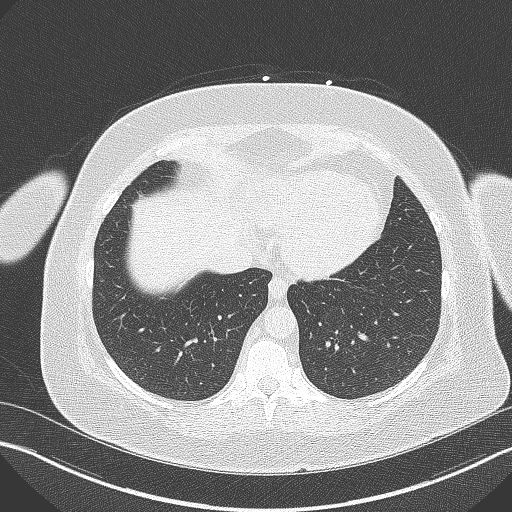
[im 23/68  lung]
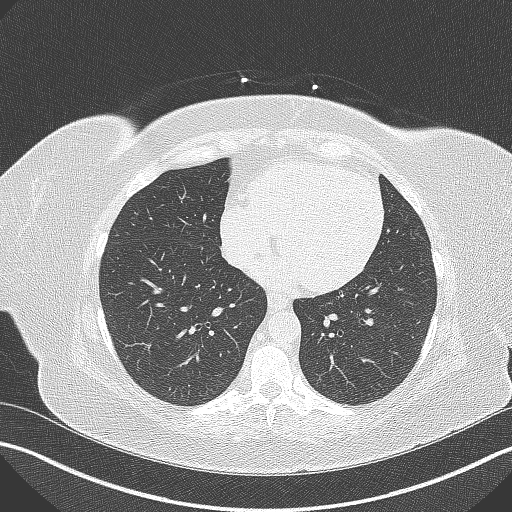
[im 34/68  lung]
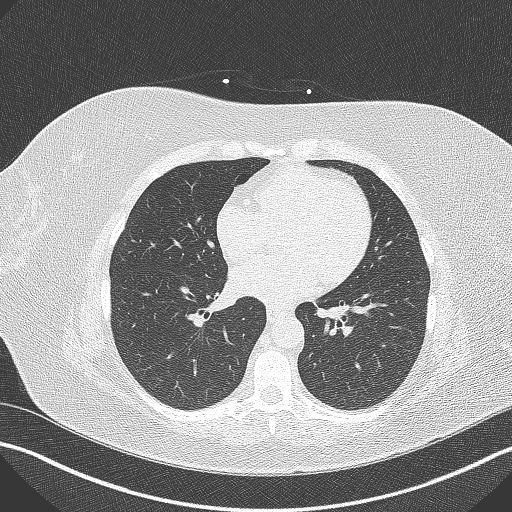
[im 45/68  lung]
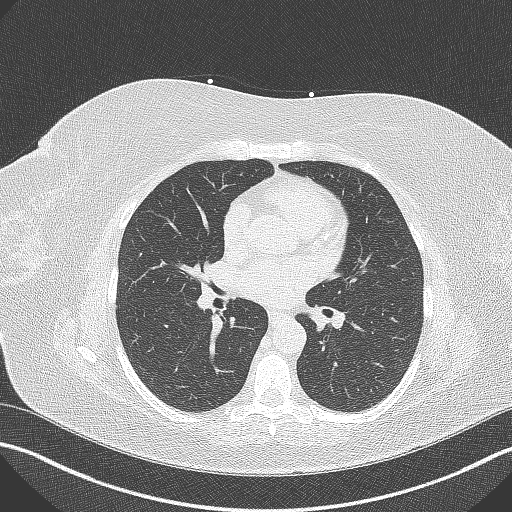
[im 56/68  lung]
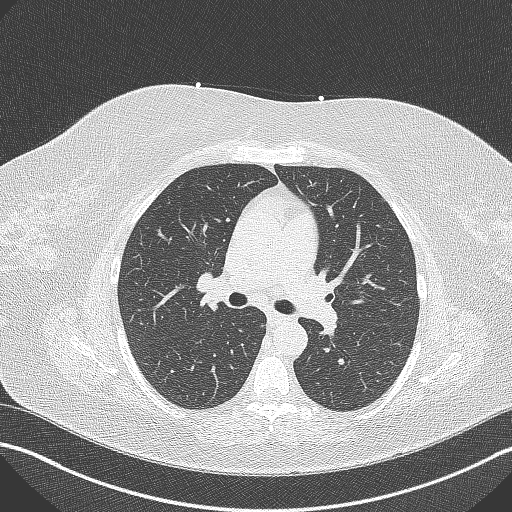

[14 of 20 positions shown; findings below may reference images not displayed]

FINDINGS: Atherosclerotic calcifications in the thoracic aorta. Within the
visualized portions of the thorax there are no suspicious appearing
pulmonary nodules or masses, there is no acute consolidative
airspace disease, no pleural effusions, no pneumothorax and no
lymphadenopathy. Visualized portions of the upper abdomen are
unremarkable. There are no aggressive appearing lytic or blastic
lesions noted in the visualized portions of the skeleton.
IMPRESSION: 1.  Aortic Atherosclerosis (E56RL-H2Y.Y).
FINDINGS: Coronary Calcium Score:

Left main: 0

Left anterior descending artery:

Left circumflex artery:

Right coronary artery:

Total: 132

Percentile: 93

Pericardium: Normal.

Ascending Aorta: Normal caliber.  Aortic atherosclerosis

Non-cardiac: See separate report from [REDACTED].
IMPRESSION: Coronary calcium score of 132. This was 93 percentile for age-,
race-, and sex-matched controls.

Aortic atherosclerosis



If CAC=0, it is reasonable to withhold statin therapy and reassess
in 5 to 10 years, as long as higher risk conditions are absent
(diabetes mellitus, family history of premature CHD in first degree
relatives (males <55 years; females <65 years), cigarette smoking,
or LDL >=190 mg/dL).

If CAC is 1 to 99, it is reasonable to initiate statin therapy for
patients >=55 years of age.

If CAC is >=100 or >=75th percentile, it is reasonable to initiate
statin therapy at any age.

Cardiology referral should be considered for patients with CAC
scores >=400 or >=75th percentile.

*0148 AHA/ACC/AACVPR/AAPA/ABC/XYOOJ/HADJA/LEELA/Aujla/HELI/OTF/KIKAWADA
Guideline on the Management of Blood Cholesterol: A Report of the
American College of Cardiology/American Heart Association Task Force
on Clinical Practice Guidelines. J Am Coll Cardiol.
6725;73(24):9912-9401.

*** End of Addendum ***
EXAM:
OVER-READ INTERPRETATION  CT CHEST

The following report is an over-read performed by radiologist Dr.
Annet Spring [REDACTED] on 02/19/2021. This
over-read does not include interpretation of cardiac or coronary
anatomy or pathology. The coronary calcium score interpretation by
the cardiologist is attached.
FINDINGS: Atherosclerotic calcifications in the thoracic aorta. Within the
visualized portions of the thorax there are no suspicious appearing
pulmonary nodules or masses, there is no acute consolidative
airspace disease, no pleural effusions, no pneumothorax and no
lymphadenopathy. Visualized portions of the upper abdomen are
unremarkable. There are no aggressive appearing lytic or blastic
lesions noted in the visualized portions of the skeleton.
IMPRESSION: 1.  Aortic Atherosclerosis (E56RL-H2Y.Y).

## 2022-12-08 ENCOUNTER — Ambulatory Visit: Payer: 59

## 2022-12-08 ENCOUNTER — Telehealth: Payer: Self-pay | Admitting: Pharmacist Clinician (PhC)/ Clinical Pharmacy Specialist

## 2022-12-08 ENCOUNTER — Encounter: Payer: Self-pay | Admitting: Cardiology

## 2022-12-08 ENCOUNTER — Ambulatory Visit: Payer: 59 | Attending: Cardiology | Admitting: Pharmacist Clinician (PhC)/ Clinical Pharmacy Specialist

## 2022-12-08 ENCOUNTER — Ambulatory Visit: Payer: 59 | Attending: Cardiology | Admitting: Cardiology

## 2022-12-08 ENCOUNTER — Other Ambulatory Visit (HOSPITAL_COMMUNITY): Payer: Self-pay

## 2022-12-08 VITALS — BP 110/86 | HR 72 | Ht 61.0 in | Wt 195.4 lb

## 2022-12-08 DIAGNOSIS — E785 Hyperlipidemia, unspecified: Secondary | ICD-10-CM

## 2022-12-08 DIAGNOSIS — R931 Abnormal findings on diagnostic imaging of heart and coronary circulation: Secondary | ICD-10-CM

## 2022-12-08 DIAGNOSIS — I4891 Unspecified atrial fibrillation: Secondary | ICD-10-CM

## 2022-12-08 NOTE — Patient Instructions (Addendum)
Medication Instructions:  Your physician recommends that you continue on your current medications as directed. Please refer to the Current Medication list given to you today.  *If you need a refill on your cardiac medications before your next appointment, please call your pharmacy*   Lab Work: In 2 months: Lipids - please come fasting If you have labs (blood work) drawn today and your tests are completely normal, you will receive your results only by: MyChart Message (if you have MyChart) OR A paper copy in the mail If you have any lab test that is abnormal or we need to change your treatment, we will call you to review the results.   Testing/Procedures: Christena Deem- Long Term Monitor Instructions  Your physician has requested you wear a ZIO patch monitor for 14 days.  This is a single patch monitor. [employer] supplies one patch monitor per enrollment. Additional stickers are not available. Please do not apply patch if you will be having a Nuclear Stress Test,  Echocardiogram, Cardiac CT, MRI, or Chest Xray during the period you would be wearing the  monitor. The patch cannot be worn during these tests. You cannot remove and re-apply the  ZIO XT patch monitor.  Your ZIO patch monitor will be mailed 3 day USPS to your address on file. It may take 3-5 days  to receive your monitor after you have been enrolled.  Once you have received your monitor, please review the enclosed instructions. Your monitor  has already been registered assigning a specific monitor serial # to you.  Billing and Patient Assistance Program Information  We have supplied [employer] with any of your insurance information on file for billing purposes. [employer] offers a sliding scale Patient Assistance Program for patients that do not have  insurance, or whose insurance does not completely cover the cost of the ZIO monitor.  You must apply for the Patient Assistance Program to qualify for this discounted rate.  To apply, please  call [employer] at [phone], select option 4, select option 2, ask to apply for  Patient Assistance Program. Meredeth Ide will ask your household income, and how many people  are in your household. They will quote your out-of-pocket cost based on that information.  [employer] will also be able to set up a 16-month, interest-free payment plan if needed.  Applying the monitor   Shave hair from upper left chest.  Hold abrader disc by orange tab. Rub abrader in 40 strokes over the upper left chest as  indicated in your monitor instructions.  Clean area with 4 enclosed alcohol pads. Let dry.  Apply patch as indicated in monitor instructions. Patch will be placed under collarbone on left  side of chest with arrow pointing upward.  Rub patch adhesive wings for 2 minutes. Remove white label marked "1". Remove the white  label marked "2". Rub patch adhesive wings for 2 additional minutes.  While looking in a mirror, press and release button in center of patch. A small green light will  flash 3-4 times. This will be your only indicator that the monitor has been turned on.  Do not shower for the first 24 hours. You may shower after the first 24 hours.  Press the button if you feel a symptom. You will hear a small click. Record Date, Time and  Symptom in the Patient Logbook.  When you are ready to remove the patch, follow instructions on the last 2 pages of Patient  Logbook. Stick patch monitor onto the last page of  Patient Logbook.  Place Patient Logbook in the blue and white box. Use locking tab on box and tape box closed  securely. The blue and white box has prepaid postage on it. Please place it in the mailbox as  soon as possible. Your physician should have your test results approximately 7 days after the  monitor has been mailed back to [facility].  Call [facility] Customer Care at [phone] if you have questions regarding  your ZIO XT patch monitor. Call them immediately if you see an  orange light blinking on your  monitor.  If your monitor falls off in less than 4 days, contact our Monitor department at [phone].  If your monitor becomes loose or falls off after 4 days call [employer] at [phone] for  suggestions on securing your monitor    Follow-Up: At [facility], you and your health needs are our priority.  As part of our continuing mission to provide you with exceptional heart care, we have created designated Provider Care Teams.  These Care Teams include your primary Cardiologist (physician) and Advanced Practice Providers (APPs -  Physician Assistants and Nurse Practitioners) who all work together to provide you with the care you need, when you need it.  We recommend signing up for the patient portal called "MyChart".  Sign up information is provided on this After Visit Summary.  MyChart is used to connect with patients for Virtual Visits (Telemedicine).  Patients are able to view lab/test results, encounter notes, upcoming appointments, etc.  Non-urgent messages can be sent to your provider as well.   To learn more about what you can do with MyChart, go to [email or web address].    Your next appointment:   2 month(s)  Provider:   Rollene Rotunda, MD

## 2022-12-08 NOTE — Progress Notes (Unsigned)
Enrolled for [employer] to mail a ZIO XT long term holter monitor to the patients address on file. Requested monitor to be mailed 01/05/23 so patient can apply when back in the country on 01/10/23.

## 2022-12-08 NOTE — Progress Notes (Signed)
Cardiology Office Note:   Date:  12/08/2022  ID:  Hailey Sexton, DOB 1960-03-21, MRN [id number] PCP: Patient, No Pcp Per  [facility] Providers Cardiologist:  Rollene Rotunda, MD {  History of Present Illness:   Hailey Sexton is a 62 y.o. female in 2019 and she had a CT with minimal plaque and mild coronary calcium.  She presents for follow-up.  She is been having some palpitations and actually shows me her phone.  There were some episodes of brief atrial fibrillation.  She has not had any presyncope or syncope.  She has been getting hot flashes and they want start steroid replacement.  She is having some difficulty sleeping.  Her biggest issue has been weight.  She is taking a compounded GLP-1 receptor agonist but does not seem to be working.  She is swimming.  She is eating right.  She is dieting and she still cannot lose weight.  She has dyslipidemia and I tried to treat her with statins but she has been intolerant.  She is taking something called cholesterol complete which has some niacin and red yeast rice.  I looked at the ingredients.  She is not having any new chest pressure, neck or arm discomfort.  She is not having any new shortness of breath, PND or orthopnea.  ROS: As stated in the HPI and negative for all other systems.  Studies Reviewed:    EKG:   EKG Interpretation Date/Time:  Thursday December 08 2022 10:02:19 EDT Ventricular Rate:  72 PR Interval:  194 QRS Duration:  74 QT Interval:  392 QTC Calculation: 429 R Axis:   6  Text Interpretation: Normal sinus rhythm Normal ECG Confirmed by Rollene Rotunda ([id number]) on 12/08/2022 10:04:43 AM    Risk Assessment/Calculations:              Physical Exam:   VS:  BP 110/86 (BP Location: Left Arm, Patient Position: Sitting, Cuff Size: Normal)   Pulse 72   Ht 5\' 1"  (1.549 m)   Wt 195 lb 6.4 oz (88.6 kg)   SpO2 96%   BMI 36.92 kg/m    Wt Readings from Last 3 Encounters:  12/08/22 195 lb 6.4 oz (88.6 kg)  12/02/21  195 lb 9.6 oz (88.7 kg)  01/15/21 204 lb (92.5 kg)     GEN: Well nourished, well developed in no acute distress NECK: No JVD; No carotid bruits CARDIAC: RRR, no murmurs, rubs, gallops RESPIRATORY:  Clear to auscultation without rales, wheezing or rhonchi  ABDOMEN: Soft, non-tender, non-distended EXTREMITIES:  No edema; No deformity   ASSESSMENT AND PLAN:   ELEVATED CORONARY CALCIUM:   We are going to pursue aggressive primary risk reduction as below.  PALPITATIONS: It looks like there are episodes of atrial fibrillation and I am going to have her wear a 2-week monitor.  Right now she has a low thromboembolic risk so I do not feel compelled to start DOAC.   DYSLIPIDEMIA:   She is unable to take a statin.  She is taking this over-the-counter medication mentioned above.  I will check a lipid profile in 2 months and if her lipids are not down I would start a PCSK9 with a goal LDL at least in the 70s.   HYPERGLYCEMIA: Her A1c was 5.9.  It has been higher previously.  She is pursuing diet control.   OBESITY: I would like to try to get her on GLP-1 receptor agonist.  I will check with our pharmacy.  Follow up with me in two months.   Signed, Rollene Rotunda, MD

## 2022-12-08 NOTE — Assessment & Plan Note (Signed)
  Patient has not met goal of at least 5% of body weight loss with comprehensive lifestyle modifications alone in the past 3-6 months. Pharmacotherapy is appropriate to pursue as augmentation. Will start Zepbound.   Confirmed patient not pregnant and no personal or family history of medullary thyroid carcinoma (MTC) or Multiple Endocrine Neoplasia syndrome type 2 (MEN 2).   Advised patient on common side effects including nausea, diarrhea, dyspepsia, decreased appetite, and fatigue. Counseled patient on reducing meal size and how to titrate medication to minimize side effects. Patient aware to call if intolerable side effects or if experiencing dehydration, abdominal pain, or dizziness. Patient will adhere to dietary modifications and will target at least 150 minutes of moderate intensity exercise weekly.   Injection technique reviewed at today's visit.    Titration Plan:  Will plan to follow the titration plan as below, pending patient is tolerating each dose before increasing to the next. Can slow titration if needed for tolerability.    -Month 1: Inject 2.5 mg SQ once weekly x 4 weeks -Month 2: Inject 5 mg SQ once weekly x 4 weeks -Month 3: Inject 7.5 mg SQ once weekly x 4 weeks -Month 4: Inject 10 mg SQ once weekly x 4 weeks  Follow up in 3 months.

## 2022-12-08 NOTE — Patient Instructions (Signed)
We will start the prior authorization process to get Zepbound or Wegovy covered by your insurance.   TIPS FOR SUCCESS Write down the reasons why you want to lose weight and post it in a place where you'll see it often. Start small and work your way up. Keep in mind that it takes time to achieve goals, and small steps add up. Any additional movements help to burn calories. Taking the stairs rather than the elevator and parking at the far end of your parking lot are easy ways to start. Brisk walking for at least 30 minutes 4 or more days of the week is an excellent goal to work toward  [employer] WHAT IT MEANS TO FEEL FULL Did you know that it can take 15 minutes or more for your brain to receive the message that you've eaten? That means that, if you eat less food, but consume it slower, you may still feel satisfied. Eating a lot of fruits and vegetables can also help you feel fuller. Eat off of smaller plates so that moderate portions don't seem too small  TITRATION PLAN Will plan to follow the titration plan as below, pending patient is tolerating each dose before increasing to the next. Can slow titration if needed for tolerability.    -Weeks 1-4: Inject 2.5 mg SQ once weekly  -Weeks 5-8: Inject 5 mg SQ once weekly  -Weeks 9-12 Inject 7.5 mg SQ once weekly   Follow up in 3 months.  If you have any questions or concerns, please reach out to [place].  Gehrig Patras/Chris at [phone].  THANK YOU FOR CHOOSING [facility]

## 2022-12-08 NOTE — Progress Notes (Signed)
Office Visit    Patient Name: Hailey Sexton Date of Encounter: 12/08/2022  Primary Care Provider:  Patient, No Pcp Per Primary Cardiologist:  Rollene Rotunda, MD  Chief Complaint    Weight management  Significant Past Medical History   CAD 10/23 CAC 174 (94th percentile)  HLD 10/23 LDL 134, Lp(a) 9.8 - on rosuvastatin 20  preDM 10/23 A1c 5.8    No Known Allergies  History of Present Illness    Hailey Sexton is a 62 y.o. female patient of Dr Antoine Poche, in the office today to discuss options for weight management.  She saw Dr. Antoine Poche this morning and is frustrated by her inability to lose weight despite swimming regularly and eating healthy.    Current weight management medications: none  Previously tried meds: various diet programs  Current meds that may affect weight: none  Baseline weight/BMI: 88.6 kg // 36.94  Insurance payor:  Arts development officer  Diet: mostly home cooked, rarely eating out; lots of chicken and fish, egg whites, cut out dairy; vegetables usually fresh, no canned; doesn't snack much  Exercise: swimming 3-4 times per week, 60 laps, just started weight training recently  Family History: father had MI, CHF, tobacco; died at 22; mother had CABG, MI, died at 68; brother CABG x 5, living;   Confirmed patient not pregnant and no personal or family history of medullary thyroid carcinoma (MTC) or Multiple Endocrine Neoplasia syndrome type 2 (MEN 2).   Social History:   Tobacco: none  Alcohol: not in last few months  Caffeine:  occasional coffee,    Accessory Clinical Findings    Lab Results  Component Value Date   CREATININE 0.67 02/20/2017   BUN 15 02/20/2017   NA 143 02/20/2017   K 4.6 02/20/2017   CL 105 02/20/2017   CO2 20 02/20/2017   No results found for: "ALT", "AST", "GGT", "ALKPHOS", "BILITOT" Lab Results  Component Value Date   HGBA1C 5.8 (H) 12/02/2021    Home Medications/Allergies    Current Outpatient Medications   Medication Sig Dispense Refill   diazepam (VALIUM) 5 MG tablet As needed     estradiol (ESTRACE) 0.1 MG/GM vaginal cream Place 1 Applicatorful vaginally once a week.     estradiol (VIVELLE-DOT) 0.025 MG/24HR Place 1 patch onto the skin 2 (two) times a week.     fluocinonide (LIDEX) 0.05 % external solution Apply 1 Application topically as needed.     ibuprofen (ADVIL) 800 MG tablet Take 800 mg by mouth 2 (two) times daily as needed.     latanoprost (XALATAN) 0.005 % ophthalmic solution Place 1 drop into both eyes at bedtime as needed.     ondansetron (ZOFRAN-ODT) 8 MG disintegrating tablet Take 8 mg by mouth every 8 (eight) hours as needed.     progesterone (PROMETRIUM) 100 MG capsule Take 100 mg by mouth at bedtime.     rosuvastatin (CRESTOR) 20 MG tablet Take 1 tablet (20 mg total) by mouth daily. 90 tablet 3   zolpidem (AMBIEN) 10 MG tablet Take 10 mg by mouth at bedtime as needed.     No current facility-administered medications for this visit.     No Known Allergies  Assessment & Plan    Morbid obesity (HCC)  Patient has not met goal of at least 5% of body weight loss with comprehensive lifestyle modifications alone in the past 3-6 months. Pharmacotherapy is appropriate to pursue as augmentation. Will start Zepbound.   Confirmed patient not pregnant and no  personal or family history of medullary thyroid carcinoma (MTC) or Multiple Endocrine Neoplasia syndrome type 2 (MEN 2).   Advised patient on common side effects including nausea, diarrhea, dyspepsia, decreased appetite, and fatigue. Counseled patient on reducing meal size and how to titrate medication to minimize side effects. Patient aware to call if intolerable side effects or if experiencing dehydration, abdominal pain, or dizziness. Patient will adhere to dietary modifications and will target at least 150 minutes of moderate intensity exercise weekly.   Injection technique reviewed at today's visit.    Titration Plan:   Will plan to follow the titration plan as below, pending patient is tolerating each dose before increasing to the next. Can slow titration if needed for tolerability.    -Month 1: Inject 2.5 mg SQ once weekly x 4 weeks -Month 2: Inject 5 mg SQ once weekly x 4 weeks -Month 3: Inject 7.5 mg SQ once weekly x 4 weeks -Month 4: Inject 10 mg SQ once weekly x 4 weeks  Follow up in 3 months.    Phillips Hay PharmD CPP [facility]  [address] [place], [place] [address] [phone]

## 2022-12-08 NOTE — Telephone Encounter (Signed)
Please do PA for Zepbound (if not on formulary, try [facility])   Thank you

## 2022-12-09 ENCOUNTER — Telehealth: Payer: Self-pay | Admitting: Pharmacy Technician

## 2022-12-09 ENCOUNTER — Other Ambulatory Visit (HOSPITAL_COMMUNITY): Payer: Self-pay

## 2022-12-09 NOTE — Telephone Encounter (Signed)
Pharmacy Patient Advocate Encounter   Received notification from Pt Calls Messages that prior authorization for zepbound is required/requested.   Insurance verification completed.   The patient is insured through [facility] ADVANTAGE/RX ADVANCE .   Per test claim: PA required; PA submitted to above mentioned insurance via CoverMyMeds Key/confirmation #/EOC [id number] Status is pending

## 2022-12-09 NOTE — Telephone Encounter (Signed)
Pharmacy Patient Advocate Encounter  Received notification from [facility] ADVANTAGE/RX ADVANCE that Prior Authorization for zepbound has been APPROVED from 12/09/22 to 08/07/23. Ran test claim, Copay is $51.75- one month. This test claim was processed through [facility]- copay amounts may vary at other pharmacies due to pharmacy/plan contracts, or as the patient moves through the different stages of their insurance plan.   PA #/Case ID/Reference #: [id number]

## 2022-12-12 NOTE — Telephone Encounter (Signed)
See other encounter.

## 2022-12-12 NOTE — Telephone Encounter (Signed)
LMOM - need to determine pharmacy for Zepbound, set up 3 month follow up.

## 2022-12-21 NOTE — Telephone Encounter (Signed)
Mailbox full, unable to leave message

## 2022-12-23 MED ORDER — ZEPBOUND 2.5 MG/0.5ML ~~LOC~~ SOAJ: 2.5000 mg | SUBCUTANEOUS | 0 refills | Status: DC

## 2022-12-23 MED ORDER — ZEPBOUND 5 MG/0.5ML ~~LOC~~ SOAJ: 5.0000 mg | SUBCUTANEOUS | 0 refills | Status: DC

## 2022-12-23 NOTE — Telephone Encounter (Signed)
Patient returned call, would like to pick up at [facility] at [facility]

## 2022-12-23 NOTE — Addendum Note (Signed)
Addended by: Rosalee Kaufman on: 12/23/2022 03:42 PM   Modules accepted: Orders

## 2023-02-14 ENCOUNTER — Other Ambulatory Visit: Payer: Self-pay | Admitting: *Deleted

## 2023-02-14 ENCOUNTER — Encounter: Payer: Self-pay | Admitting: *Deleted

## 2023-02-14 ENCOUNTER — Telehealth: Payer: Self-pay | Admitting: Cardiology

## 2023-02-14 DIAGNOSIS — E785 Hyperlipidemia, unspecified: Secondary | ICD-10-CM

## 2023-02-14 DIAGNOSIS — R931 Abnormal findings on diagnostic imaging of heart and coronary circulation: Secondary | ICD-10-CM

## 2023-02-14 NOTE — Telephone Encounter (Signed)
 Called and spoke with patient. Discussed how to decide if its right to increase the dose or not. Patient still has 1 more injection left. Will call back at the end of the week or next week to let us know what she would like to do.

## 2023-02-14 NOTE — Telephone Encounter (Signed)
 Pt c/o medication issue:  1. Name of Medication:   tirzepatide  (ZEPBOUND ) 5 MG/0.5ML Pen   2. How are you currently taking this medication (dosage and times per day)?   3. Are you having a reaction (difficulty breathing--STAT)?   4. What is your medication issue?   Patient wants to know if this medication will increase.  Patient noted she is almost out of this medication and will need to get a refill.

## 2023-02-14 NOTE — Telephone Encounter (Signed)
 Called and spoke to patient. Verified name and DOB. Patient called to see if her medication Zepbound is due an increase? Please advise.

## 2023-02-16 NOTE — Telephone Encounter (Signed)
 Pt calling to speak with a pharmacist

## 2023-02-16 NOTE — Telephone Encounter (Signed)
 Pt calling in stating this is "time sensitive" and she needs a refill sent to pharmacy below -   [facility] - [place], [place] - [address] [facility] [phone] [address] [place] [address]

## 2023-02-17 ENCOUNTER — Telehealth: Payer: Self-pay | Admitting: Cardiology

## 2023-02-17 MED ORDER — ZEPBOUND 5 MG/0.5ML ~~LOC~~ SOAJ: 5.0000 mg | SUBCUTANEOUS | 1 refills | Status: DC

## 2023-02-17 MED ORDER — MOUNJARO 7.5 MG/0.5ML ~~LOC~~ SOAJ: 7.5000 mg | SUBCUTANEOUS | 0 refills | Status: DC

## 2023-02-17 NOTE — Telephone Encounter (Signed)
 Spoke with patient.  She would like to continue with the 5 mg dose for another month.  Will send to [facility]

## 2023-02-17 NOTE — Telephone Encounter (Signed)
 Pt would like a call back or call medication in asap

## 2023-02-17 NOTE — Telephone Encounter (Signed)
 Pt c/o medication issue:  1. Name of Medication: tirzepatide  (ZEPBOUND ) 5 MG/0.5ML Pen   2. How are you currently taking this medication (dosage and times per day)?   3. Are you having a reaction (difficulty breathing--STAT)?   4. What is your medication issue? Patient would like to like to change medication to 7mg .

## 2023-02-19 NOTE — Telephone Encounter (Signed)
 See other encounter.

## 2023-02-20 ENCOUNTER — Telehealth: Payer: Self-pay | Admitting: Cardiology

## 2023-02-20 NOTE — Telephone Encounter (Signed)
 Lavona Agent, MD  Rufina Anniece LABOR, RN Caller: Unspecified (Today, 11:06 AM) I would suggest either a wearable like an Apple Watch or Fitbit or she can get a Kardia device.       Called and left VMM for patient with the above listed recommendations.

## 2023-02-20 NOTE — Telephone Encounter (Signed)
 Pt would like a c/b regarding a different alternative due to monitor not sticking and causing irritation. Please advise

## 2023-02-20 NOTE — Telephone Encounter (Signed)
 Called and spoke with patient. She states that the Zio patch will not stay on and she has already called the company and requested a new one and it fell off at well. States she did the prep correctly but has intense hot flashes and is wondering if that is why they are falling off.  Advised patient that I would speak with Dr Lavona and the sleep team to see what other system she could try.  She also states she is going to be out traveling and wants the new system delivered to a different address. That is [address]  [place] [address].

## 2023-02-20 NOTE — Telephone Encounter (Signed)
 Received call back from patient after I had left a VMM with Dr Hochrein's recommendations.  She states that she already has the [employer] and wants him to consider a different type of monitor ie like the old time halter monitors with the shoulder strap. I informed her that he is currently out of the office but I will send him a message asking for another recommendation.

## 2023-02-23 ENCOUNTER — Other Ambulatory Visit: Payer: Self-pay | Admitting: Cardiology

## 2023-02-23 ENCOUNTER — Other Ambulatory Visit: Payer: Self-pay

## 2023-02-23 ENCOUNTER — Ambulatory Visit: Payer: 59 | Attending: Cardiology

## 2023-02-23 ENCOUNTER — Encounter: Payer: Self-pay | Admitting: *Deleted

## 2023-02-23 DIAGNOSIS — R072 Precordial pain: Secondary | ICD-10-CM

## 2023-02-23 DIAGNOSIS — R002 Palpitations: Secondary | ICD-10-CM

## 2023-02-23 DIAGNOSIS — I4891 Unspecified atrial fibrillation: Secondary | ICD-10-CM

## 2023-02-23 NOTE — Progress Notes (Signed)
ZIO monitor order from 12-08-22 cancelled.  Patient enrolled for [facility] [employer] to ship a 14 day long term monitor with sensitive skin electrodes to [address], [address] [place], [place] [address].

## 2023-02-23 NOTE — Progress Notes (Unsigned)
Long term monitor with sensitive skin electrodes to be shipped to  [address] [place], [place]  [address].

## 2023-03-03 ENCOUNTER — Ambulatory Visit: Payer: 59 | Admitting: Cardiology

## 2023-03-03 ENCOUNTER — Other Ambulatory Visit (HOSPITAL_COMMUNITY): Payer: Self-pay

## 2023-03-03 ENCOUNTER — Other Ambulatory Visit (HOSPITAL_BASED_OUTPATIENT_CLINIC_OR_DEPARTMENT_OTHER): Payer: Self-pay | Admitting: Pharmacist Clinician (PhC)/ Clinical Pharmacy Specialist

## 2023-03-03 MED ORDER — ZEPBOUND 7.5 MG/0.5ML ~~LOC~~ SOAJ: 7.5000 mg | SUBCUTANEOUS | 1 refills | Status: DC

## 2023-03-15 ENCOUNTER — Telehealth: Payer: Self-pay | Admitting: Cardiology

## 2023-03-15 NOTE — Telephone Encounter (Signed)
*  STAT* If patient is at the pharmacy, call can be transferred to refill team.   1. Which medications need to be refilled? (please list name of each medication and dose if known)  tirzepatide  (ZEPBOUND ) 7.5 MG/0.5ML Pen    2. Which pharmacy/location (including street and city if local pharmacy) is medication to be sent to? [id number] [place], [place] - [address]   3. Do they need a 30 day or 90 day supply?  Standard supply  Patient would like to transfer prescription to the pharmacy listed above.

## 2023-03-16 MED ORDER — ZEPBOUND 7.5 MG/0.5ML ~~LOC~~ SOAJ: 7.5000 mg | SUBCUTANEOUS | 1 refills | Status: DC

## 2023-03-23 ENCOUNTER — Telehealth: Payer: Self-pay | Admitting: Cardiology

## 2023-03-23 DIAGNOSIS — Z79899 Other long term (current) drug therapy: Secondary | ICD-10-CM

## 2023-03-23 DIAGNOSIS — E785 Hyperlipidemia, unspecified: Secondary | ICD-10-CM

## 2023-03-23 NOTE — Telephone Encounter (Signed)
Patient is calling requesting orders be placed for a CBC and A1c to be drawn when she comes in for the lipid panel as well as any other orders that check vitamin levels.   She reports a provider in [place] told her all her vitamin levels are low which caused her to start taking supplements that she wants to confirm if working.    Please advise.

## 2023-03-26 DIAGNOSIS — R072 Precordial pain: Secondary | ICD-10-CM | POA: Diagnosis not present

## 2023-03-26 DIAGNOSIS — I4891 Unspecified atrial fibrillation: Secondary | ICD-10-CM

## 2023-03-26 DIAGNOSIS — R002 Palpitations: Secondary | ICD-10-CM | POA: Diagnosis not present

## 2023-03-27 ENCOUNTER — Other Ambulatory Visit: Payer: Self-pay

## 2023-03-27 NOTE — Telephone Encounter (Signed)
 Orders placed for additional lab work. Called pt to let her know about the additional lab orders. Pt verbalized understanding. Request I send the order form to her. Lab slips placed in outgoing mail.

## 2023-03-27 NOTE — Telephone Encounter (Signed)
 Called pt to let her know Dr. Antoine Poche was okay with adding CBC and A1c. She states she needs her vitamin levels checked too. "I've been taking all these vitamins. Vit  D, K, C and iron; also calcium. I want to make sure my levels are good. I was told I'm anaemic." Will get message to provider for review.

## 2023-03-29 ENCOUNTER — Telehealth: Payer: Self-pay | Admitting: Cardiology

## 2023-03-29 NOTE — Telephone Encounter (Signed)
 Error

## 2023-03-30 ENCOUNTER — Telehealth: Payer: Self-pay | Admitting: Pharmacist

## 2023-03-30 NOTE — Telephone Encounter (Signed)
 Patient called. Currently on Zepbound 7.5mg  once weekly. Reports raised spot on abdomen after last injection. Slightly itchy. Advised patient to take a benedryl tablet and skip tomorrows dose to see if swelling goes away.

## 2023-03-30 NOTE — Telephone Encounter (Signed)
 Pt c/o medication issue:  1. Name of Medication: Zepbound  2. How are you currently taking this medication (dosage and times per day)?   3. Are you having a reaction (difficulty breathing--STAT)?   4. What is your medication issue? She thinks she is having an allergic reaction to this medicine.

## 2023-04-17 ENCOUNTER — Telehealth: Payer: Self-pay | Admitting: Cardiology

## 2023-04-17 NOTE — Telephone Encounter (Signed)
*  STAT* If patient is at the pharmacy, call can be transferred to refill team.   1. Which medications need to be refilled? (please list name of each medication and dose if known) tirzepatide (ZEPBOUND) 7.5 MG/0.5ML Pen   2. Which pharmacy/location (including street and city if local pharmacy) is medication to be sent to? [id number] [place], [place] - [address]    3. Do they need a 30 day or 90 day supply? 90  Pt c/o medication issue:  1. Name of Medication: tirzepatide (ZEPBOUND) 7.5 MG/0.5ML Pen   2. How are you currently taking this medication (dosage and times per day)? 7.5 mg, Weekly   3. Are you having a reaction (difficulty breathing--STAT)? Just redness, itching  4. What is your medication issue? Iredness at injection site w/ slight itching, relieved slightly with benadryl. Started once dose was increased to 7.5

## 2023-04-17 NOTE — Telephone Encounter (Signed)
 Hailey Sexton, [place]  You18 minutes ago (4:08 PM)    Last prescription for [facility] we sent on 03/16/23 we approved for 1 month with 1 refill patient should be able to refill it for one more time.   Attempted to call patient, no answer, mailbox full unable to leave voicemail.

## 2023-04-17 NOTE — Telephone Encounter (Signed)
 Patient identification verified by 2 forms. Marilynn Rail, RN    Called and spoke to patient  Patient states:   -she has injection site reaction with the 7.5mg    -did not have reaction with previous injections   -looks like a big bug bite that lasts a few days   -No other symptoms or concerns   -refill request is time sensitive, would like completed soon  Informed patient message sent to pharmacy for request  Patient has no further questions at this time

## 2023-04-18 NOTE — Telephone Encounter (Signed)
 Patient identification verified by 2 forms. Hailey Rail, RN    Called and spoke to patient  Advised patient of available refill  Patient states:  -she will outreach pharmacy  -continues to have injection site reaction  -reaction resolves after a few day  -she will try injecting in different area  Advised patient to monitor symptoms outreach if change or worsen  Patient has no further questions at this time

## 2023-05-04 ENCOUNTER — Ambulatory Visit: Payer: 59 | Admitting: Cardiology

## 2023-05-18 ENCOUNTER — Ambulatory Visit: Payer: 59 | Admitting: Cardiology

## 2023-05-18 ENCOUNTER — Telehealth: Payer: Self-pay | Admitting: Cardiology

## 2023-05-18 ENCOUNTER — Encounter: Payer: Self-pay | Admitting: Pharmacist Clinician (PhC)/ Clinical Pharmacy Specialist

## 2023-05-18 ENCOUNTER — Other Ambulatory Visit (HOSPITAL_COMMUNITY): Payer: Self-pay

## 2023-05-18 ENCOUNTER — Telehealth: Payer: Self-pay | Admitting: Pharmacy Technician

## 2023-05-18 ENCOUNTER — Ambulatory Visit: Payer: 59 | Attending: Cardiovascular Disease | Admitting: Pharmacist Clinician (PhC)/ Clinical Pharmacy Specialist

## 2023-05-18 ENCOUNTER — Telehealth: Payer: Self-pay | Admitting: Pharmacist Clinician (PhC)/ Clinical Pharmacy Specialist

## 2023-05-18 LAB — CBC

## 2023-05-18 MED ORDER — WEGOVY 0.25 MG/0.5ML ~~LOC~~ SOAJ: 0.2500 mg | SUBCUTANEOUS | 0 refills | Status: DC

## 2023-05-18 NOTE — Progress Notes (Signed)
 Office Visit    Patient Name: Hailey Sexton Date of Encounter: 05/18/2023  Primary Care Provider:  Patient, No Pcp Per Primary Cardiologist:  Rollene Rotunda, MD  Chief Complaint    Weight management  Significant Past Medical History   CAD 10/23 CAC 174 (94th percentile)  HLD 10/23 LDL 134, Lp(a) 9.8 - on rosuvastatin 20  preDM 10/23 A1c 5.8    No Known Allergies  History of Present Illness    Hailey Sexton is a 63 y.o. female patient of Dr Antoine Poche, in the office today to discuss options for weight management.  I saw her first back in late October and we were able to get Zepbound approved by her insurance.   Because of some holiday issues, she wasn't able to start until mid-December.   She has now taken Zepbound for 16 weeks (she did take the 7.5 mg dose for 8 weeks).   Unfortunately she has not lost significant weight (2 pounds by our office scale), and developed injection site reactions, that started when she started the 5 mg dose.  Based on pictures she showed, they appeared to be approx 3" x 2" and would take close to a week to dissipate.  She also notes that her desire for food has decreased, and she is not snacking anymore.     She saw Dr. Antoine Poche this morning and is frustrated by her inability to lose weight despite swimming regularly and eating healthy.    Having reaction after injections - appears within 12-24 hrs, last for about 5 days.  Itchy raised welt.   Started in Dec  Eatins sushi with no rice, last night had pork chop with vegetables, salad, is not looking for food, but not eating a lot less, no alcohol; no cravings for sweets  Did 360 class, hurt lower back; swims several days per week  Current weight management medications: none  Previously tried meds: various diet programs  Current meds that may affect weight: none  Baseline weight/BMI: 88.6 kg // 36.94  Current weight/BMI:87.8 // 36.52  Difference: ~ 2 pounds  Insurance payor:  Art gallery manager  Diet: mostly home cooked, rarely eating out; lots of chicken and fish, egg whites, cut out dairy; vegetables usually fresh, no canned; doesn't snack much  Exercise: swimming 3-4 times per week, 60 laps, trying other exercise options at the gym as well  (strained her back yesterday while exercising)  Family History: father had MI, CHF, tobacco; died at 80; mother had CABG, MI, died at 16; brother CABG x 5, living;   Confirmed patient not pregnant and no personal or family history of medullary thyroid carcinoma (MTC) or Multiple Endocrine Neoplasia syndrome type 2 (MEN 2).   Social History:   Tobacco: none  Alcohol: not in last few months  Caffeine:  occasional coffee,    Accessory Clinical Findings    Lab Results  Component Value Date   CREATININE 0.67 02/20/2017   BUN 15 02/20/2017   NA 143 02/20/2017   K 4.6 02/20/2017   CL 105 02/20/2017   CO2 20 02/20/2017   No results found for: "ALT", "AST", "GGT", "ALKPHOS", "BILITOT" Lab Results  Component Value Date   HGBA1C 5.8 (H) 12/02/2021    Home Medications/Allergies    Current Outpatient Medications  Medication Sig Dispense Refill   diazepam (VALIUM) 5 MG tablet As needed     estradiol (ESTRACE) 0.1 MG/GM vaginal cream Place 1 Applicatorful vaginally once a week.     estradiol (  VIVELLE-DOT) 0.025 MG/24HR Place 1 patch onto the skin 2 (two) times a week.     fluocinonide (LIDEX) 0.05 % external solution Apply 1 Application topically as needed.     ibuprofen (ADVIL) 800 MG tablet Take 800 mg by mouth 2 (two) times daily as needed.     latanoprost (XALATAN) 0.005 % ophthalmic solution Place 1 drop into both eyes at bedtime as needed.     ondansetron (ZOFRAN-ODT) 8 MG disintegrating tablet Take 8 mg by mouth every 8 (eight) hours as needed.     progesterone (PROMETRIUM) 100 MG capsule Take 100 mg by mouth at bedtime.     rosuvastatin (CRESTOR) 20 MG tablet Take 1 tablet (20 mg total) by mouth daily. 90 tablet 3    tirzepatide (ZEPBOUND) 7.5 MG/0.5ML Pen Inject 7.5 mg into the skin once a week. After completing 5 mg Rx 2 mL 1   zolpidem (AMBIEN) 10 MG tablet Take 10 mg by mouth at bedtime as needed.     No current facility-administered medications for this visit.     No Known Allergies  Assessment & Plan    Morbid obesity (HCC) Patient has been unsuccessful with Zepbound and developed allergic reaction to the medication (or it's preservative).  Will have her sample Wegovy 0.25 mg to make sure she can tolerate this.  Will start PA for Wegovy 0.5 mg.  She will need to report back as to whether she continues to have allergic reaction with this medication.  If none noted, will continue with higher doses.  Stressed to her that if she has a reaction, she should stop the medication, and not continue.   Will plan to see her back in 3-4 months.    Phillips Hay PharmD CPP [facility]  [address] [place], [place] [address] [phone]

## 2023-05-18 NOTE — Patient Instructions (Signed)
 We will start the prior authorization process to get Wegovy covered by your insurance.   TIPS FOR SUCCESS Write down the reasons why you want to lose weight and post it in a place where you'll see it often. Start small and work your way up. Keep in mind that it takes time to achieve goals, and small steps add up. Any additional movements help to burn calories. Taking the stairs rather than the elevator and parking at the far end of your parking lot are easy ways to start. Brisk walking for at least 30 minutes 4 or more days of the week is an excellent goal to work toward  [employer] WHAT IT MEANS TO FEEL FULL Did you know that it can take 15 minutes or more for your brain to receive the message that you've eaten? That means that, if you eat less food, but consume it slower, you may still feel satisfied. Eating a lot of fruits and vegetables can also help you feel fuller. Eat off of smaller plates so that moderate portions don't seem too small  TITRATION PLAN Will plan to follow the titration plan as below, pending patient is tolerating each dose before increasing to the next. Can slow titration if needed for tolerability.    -Weeks 1-4: Inject 0.25 mg SQ once weekly  -Weeks 5-8: Inject 0.5 mg SQ once weekly  -Weeks 9-12 Inject 1 mg SQ once weekly  -Weeks 13-16: Inject  SQ once weekly   Follow up in 3 months.  If you have any questions or concerns, please reach out to [place].  Terre Zabriskie/Chris at [phone].  THANK YOU FOR CHOOSING [facility]

## 2023-05-18 NOTE — Telephone Encounter (Signed)
 Patient says when she went to have labs drawn she explained to a Sasha that there is a specific region not to draw labs from and a doctor previously ordered to have labs drawn from a specific vein. She says Sasha refused, and informed her that she had been drawing labs all day to reassure the patient. Patient says her hand is now blowing up and she's very upset because this could have been avoided. She says this is the worst experience she's ever had and she would like to have it resolved immediately and she doesn't know what to do with her hand. Called PharmD, no answer. Called [facility], no answer or voicemail.

## 2023-05-18 NOTE — Telephone Encounter (Signed)
 Pharmacy Patient Advocate Encounter  Received notification from [facility] that Prior Authorization for wegovy has been APPROVED from 05/18/23 to 12/18/23. Ran test claim, Copay is $24.99. This test claim was processed through [facility]- copay amounts may vary at other pharmacies due to pharmacy/plan contracts, or as the patient moves through the different stages of their insurance plan.

## 2023-05-18 NOTE — Assessment & Plan Note (Signed)
 Patient has been unsuccessful with Zepbound and developed allergic reaction to the medication (or it's preservative).  Will have her sample Wegovy 0.25 mg to make sure she can tolerate this.  Will start PA for Wegovy 0.5 mg.  She will need to report back as to whether she continues to have allergic reaction with this medication.  If none noted, will continue with higher doses.  Stressed to her that if she has a reaction, she should stop the medication, and not continue.   Will plan to see her back in 3-4 months.

## 2023-05-18 NOTE — Telephone Encounter (Signed)
 Pharmacy Patient Advocate Encounter   Received notification from Pt Calls Messages that prior authorization for [facility] is required/requested.   Insurance verification completed.   The patient is insured through [facility] .   Per test claim: PA required; PA submitted to above mentioned insurance via CoverMyMeds Key/confirmation #/EOC [id number] Status is pending

## 2023-05-18 NOTE — Telephone Encounter (Signed)
 Please do PA for Wegovy 0.5 mg dose.  She had allergic reaction to Zepbound.

## 2023-05-18 NOTE — Telephone Encounter (Signed)
 Patient identification verified by 2 forms.  Pt is very upset, sounds tearful in the phone at times.    Called and spoke to patient  Patient states:  -I told the lab tech to take blood from the vein behind my thumb above her wrist. "She told me I do this all day."  -Well she couldn't get it from the spot she stuck me, but went to where I told her in the first place.  -Now the top of my hand is swollen and if she would have just listened I think this could have been avoided!  Patient denies:  -Additional swelling  -Redness  Interventions/Plan: -Ice and elevate the affected hand -Send message to manager to address  Patient agrees with plan, no questions at this time

## 2023-05-18 NOTE — Addendum Note (Signed)
 Addended by: Rosalee Kaufman on: 05/18/2023 11:47 AM   Modules accepted: Orders

## 2023-05-18 NOTE — Telephone Encounter (Signed)
 Returned call to patient. Apologized for her experience and assured her that this feedback will be provided to the [employer]. Pt reports that her main concern is that her hand will be bruised for an upcoming wedding. Pt expressed appreciation for follow-up call and denies additional concerns at this time.

## 2023-05-19 LAB — COMPREHENSIVE METABOLIC PANEL WITH GFR
ALT: 19 IU/L (ref 0–32)
AST: 20 IU/L (ref 0–40)
Albumin: 4.9 g/dL (ref 3.9–4.9)
Alkaline Phosphatase: 79 IU/L (ref 44–121)
BUN/Creatinine Ratio: 28 (ref 12–28)
BUN: 18 mg/dL (ref 8–27)
Bilirubin Total: 0.4 mg/dL (ref 0.0–1.2)
CO2: 19 mmol/L — ABNORMAL LOW (ref 20–29)
Calcium: 9.8 mg/dL (ref 8.7–10.3)
Chloride: 103 mmol/L (ref 96–106)
Creatinine, Ser: 0.65 mg/dL (ref 0.57–1.00)
Globulin, Total: 2.2 g/dL (ref 1.5–4.5)
Glucose: 107 mg/dL — ABNORMAL HIGH (ref 70–99)
Potassium: 4.7 mmol/L (ref 3.5–5.2)
Sodium: 139 mmol/L (ref 134–144)
Total Protein: 7.1 g/dL (ref 6.0–8.5)
eGFR: 100 mL/min/{1.73_m2} (ref >59)

## 2023-05-19 LAB — LIPID PANEL
Chol/HDL Ratio: 4.2 ratio (ref 0.0–4.4)
Cholesterol, Total: 295 mg/dL — ABNORMAL HIGH (ref 100–199)
HDL: 70 mg/dL (ref >39)
LDL Chol Calc (NIH): 192 mg/dL — ABNORMAL HIGH (ref 0–99)
Triglycerides: 177 mg/dL — ABNORMAL HIGH (ref 0–149)
VLDL Cholesterol Cal: 33 mg/dL (ref 5–40)

## 2023-05-19 LAB — CBC
Hematocrit: 43.4 % (ref 34.0–46.6)
Hemoglobin: 14.4 g/dL (ref 11.1–15.9)
MCH: 28.4 pg (ref 26.6–33.0)
MCHC: 33.2 g/dL (ref 31.5–35.7)
MCV: 86 fL (ref 79–97)
Platelets: 274 10*3/uL (ref 150–450)
RBC: 5.07 x10E6/uL (ref 3.77–5.28)
RDW: 14.2 % (ref 11.7–15.4)
WBC: 7.2 10*3/uL (ref 3.4–10.8)

## 2023-05-19 LAB — HEMOGLOBIN A1C
Est. average glucose Bld gHb Est-mCnc: 120 mg/dL
Hgb A1c MFr Bld: 5.8 % — ABNORMAL HIGH (ref 4.8–5.6)

## 2023-05-19 LAB — VITAMIN B12: Vitamin B-12: 1004 pg/mL (ref 232–1245)

## 2023-05-19 LAB — VITAMIN D 25 HYDROXY (VIT D DEFICIENCY, FRACTURES): Vit D, 25-Hydroxy: 41.2 ng/mL (ref 30.0–100.0)

## 2023-05-19 NOTE — Telephone Encounter (Signed)
 Will hold prescription for Wegovy 0.5 mg until patient completes samples of 0.25 mg and shows no sign of allergic reaction.  Patient aware to call at the end of 4 weeks.

## 2023-05-29 ENCOUNTER — Telehealth: Payer: Self-pay | Admitting: Cardiology

## 2023-05-29 NOTE — Telephone Encounter (Signed)
 Pt c/o medication issue:  1. Name of Medication:   Repatha  2. How are you currently taking this medication (dosage and times per day)?   Not taking  3. Are you having a reaction (difficulty breathing--STAT)?   4. What is your medication issue?   Patient stated she had been taking Cholesterol Complete supplement and want to know if she could be started on Repatha.  Patient stated her recent cholesterol test results has been high.

## 2023-05-31 NOTE — Telephone Encounter (Signed)
 Pt has appt with PA in May - will touch base with her at that time

## 2023-06-09 ENCOUNTER — Ambulatory Visit: Payer: 59 | Admitting: Cardiology

## 2023-06-14 MED ORDER — WEGOVY 0.5 MG/0.5ML ~~LOC~~ SOAJ: 0.5000 mg | SUBCUTANEOUS | 0 refills | Status: DC

## 2023-06-14 NOTE — Telephone Encounter (Signed)
 Patient tolerating 0.25 mg Wegovy  without issue.  Will increase to 0.5 mg dose for now.  She is aware to reach out to us  regarding another dose increase in late May.

## 2023-06-14 NOTE — Addendum Note (Signed)
 Addended by: Jamill Wetmore L on: 06/14/2023 04:23 PM   Modules accepted: Orders

## 2023-06-19 ENCOUNTER — Other Ambulatory Visit: Payer: Self-pay

## 2023-06-19 ENCOUNTER — Telehealth: Payer: Self-pay | Admitting: Cardiology

## 2023-06-19 MED ORDER — WEGOVY 0.5 MG/0.5ML ~~LOC~~ SOAJ: 0.5000 mg | SUBCUTANEOUS | 0 refills | Status: DC

## 2023-06-19 NOTE — Progress Notes (Signed)
 Cardiology Office Note:    Date:  06/29/2023   ID:  Hailey Sexton, DOB 12-11-1960, MRN [id number]  PCP:  Hailey Nares, MD  Cardiologist:  Hailey Grates, MD     Referring MD: No ref. provider found   Chief Complaint: follow-up of CAD and palpitations   History of Present Illness:    Hailey Sexton is a 63 y.o. female with a history of mild non-obstructive CAD noted on coronary calcium  score in 04/2017, palpitatoins with short runs of SVT and PACs/ PVCs noted on monitor in 03/2023, hyperlipidemia intolerant to statins, prediabetes, obstructive sleep apnea, and obesity who si followed by Dr. Lavonne Prairie and presents today for routine follow-up.   Coronary CTA in 04/2017 for further evaluation of chest pain showed a coronary calcium  score of 7 (80th percentile for age and sex) with mild non-obstructive CAD noted in the proximal LAD. Repeat coronary calcium  score was elevated at 132 (93rd percentile for age and sex) in 02/2021 and 30 (94th percentile for age and sex) in 11/2021. Coronary calcifications involved the RCA and LAD.   He was last seen by Dr. Lavonne Prairie in 11/2022 at which time she reported palpitations. Tracings on phone reportedly showed brief episodes of atrial fibrillation. She was not started on anticoagulation and a 2 week Zio monitor was ordered for further evaluation. She did not wear this until 03/2023 and it  showed 31 short runs of SVT/ PAT (longest run 16 beats) and PACs/ PVCs but no atrial fibrillation.   Patient presents today for follow-up.  Patient reports brief dizziness when she rolls over in bed or when sitting up pursing in the morning.  However, she states this only lasts about 10 seconds.  She denies any history of vertigo.  She has some occasional palpitations that she describes as a fluttering sensation but nothing significant and no palpitations during episodes of dizziness.  No syncope.  No chest pain.  She describes a little shortness of breath that she is going up  the steps but nothing significant.  No orthopnea, PND, edema.  She was started on Zepbound  but did not tolerate this and was recently switched to Wegovy  which she is tolerating much better.  She has a history of obstructive sleep apnea noted on sleep study several years ago but is not on CPAP.  She has not been told that she snores but admits that her husband is a heavy sleeper.  She does describe some daytime fatigue but has difficulty sleeping at baseline.  STOP-BANG score is 3.  EKGs/Labs/Other Studies Reviewed:    The following studies were reviewed:  Coronary CTA 04/07/2017: Impressions:  1. Coronary calcium  score of 7. This was 72 percentile for age and sex matched control. 2. Normal coronary origin with right dominance. 3. Mild non-obstructive CAD in the proximal LAD. _______________  CT Cardiac Scoring 12/02/2021: Impression: Coronary calcium  score of 174. This was 94th percentile for age-, race-, and sex-matched controls.   Mild aortic atherosclerosis. _______________  Monitor 03/26/2023 to 04/09/2023: Predominant rhythm is normal sinus.   Scattered supraventricular and ventricular ectopic beats Brief runs of atrial tachycardia were not associated with symptoms No sustained arrhythmias Patient triggered event did not correlate with any specific arrhythmia.  EKG:  EKG ordered today.   EKG Interpretation Date/Time:  Thursday Jun 29 2023 08:48:44 EDT Ventricular Rate:  76 PR Interval:  182 QRS Duration:  80 QT Interval:  384 QTC Calculation: 432 R Axis:   34  Text Interpretation: Normal sinus rhythm  Normal ECG When compared with ECG of 08-Dec-2022 10:02, No significant change was found Confirmed by Sahib Pella [phone]) on 06/29/2023 8:49:36 AM    Recent Labs: 05/18/2023: ALT 19; BUN 18; Creatinine, Ser 0.65; Hemoglobin 14.4; Platelets 274; Potassium 4.7; Sodium 139  Recent Lipid Panel    Component Value Date/Time   CHOL 295 (H) 05/18/2023 1010   TRIG 177 (H)  05/18/2023 1010   HDL 70 05/18/2023 1010   CHOLHDL 4.2 05/18/2023 1010   LDLCALC 192 (H) 05/18/2023 1010    Physical Exam:    Vital Signs: BP 126/68   Pulse 77   Ht 5\' 1"  (1.549 m)   Wt 203 lb (92.1 kg)   SpO2 96%   BMI 38.36 kg/m     Wt Readings from Last 3 Encounters:  06/29/23 203 lb (92.1 kg)  05/18/23 193 lb 3.2 oz (87.6 kg)  12/08/22 195 lb 6.4 oz (88.6 kg)     General: 63 y.o. obese Caucasian female in no acute distress. HEENT: Normocephalic and atraumatic. Sclera clear.  Neck: Supple. No carotid bruits. No JVD. Heart: RRR. Distinct S1 and S2. No murmurs, gallops, or rubs.  Lungs: No increased work of breathing. Clear to ausculation bilaterally. No wheezes, rhonchi, or rales.  Extremities: No lower extremity edema.   Skin: Warm and dry. Neuro: No focal deficits. Psych: Normal affect. Responds appropriately.  Assessment:    1. Non-obstructive CAD   2. Palpitations   3. Hyperlipidemia, unspecified hyperlipidemia type   4. Prediabetes   5. Obstructive sleep apnea   6. Class 2 obesity without serious comorbidity with body mass index (BMI) of 38.0 to 38.9 in adult, unspecified obesity type     Plan:    Non-Obstructive CAD Coronary CTA in 04/2017 for further evaluation of chest pain showed a coronary calcium  score of 7 (80th percentile for age and sex) with mild non-obstructive CAD noted in the proximal LAD. Repeat coronary calcium  score was elevated at 132 (93rd percentile for age and sex) in 02/2021 and 19 (94th percentile for age and sex) in 11/2021. - No chest pain.  - Continue statin.   Of note, she asked about whether we should repeat a coronary calcium  score.  She is worried about progression of her CAD given her family history.  We reviewed her prior coronary CTA and prior coronary calcium  scores.  She denies any anginal symptoms so I explained that I do not think it is necessary for us  to repeat this but would order if she would like.  She understands that  insurance does not cover this.  After shared decision-making, decision was made to hold off on rechecking this for now.  She would like to get started on Repatha and then may consider rechecking at follow-up visit.  Palpitations Monitor in 03/2023 showed 31 short runs of SVT/ PAT (longest run 16 beats) and PACs/ PVCs but no atrial fibrillation.  - She describes occasional brief fluttering but no significant palpitations.  Hyperlipidemia Lipid panel in 05/2023: Total Cholesterol 295, Triglycerides 177, HDL 70, LDL 192. LDL goal <70 given CAD.  - Intolerant to Lipitor and Crestor  in the past due to myalgias. - She already sees PharmD for GLP-1 agonist. Will ask them to assist in starting a PCSK9 inhibitor as well.  She is agreeable to starting Repatha.  Prediabetes Hemoglobin A1c 5.8% in 05/2023.  - Continue Wegovy .   Obstructive Sleep Apnea She was a history of OSA diagnosed on a sleep study several years ago but has  not been on CPAP.  STOP-BANG score = 3 think she is at intermediate risk for moderate to severe OSA.   - Will order MR sleep study.    Obesity BMI 38. - Continue Wegovy .  Previously on Zepbound  but did not tolerate this.   - Followed in our PharmD clinic.   Disposition: Follow up in 6 months.    Sable Cram, PA-C  06/29/2023 12:56 PM    [facility]

## 2023-06-19 NOTE — Telephone Encounter (Signed)
 Spoke with pt regarding her prescription. Prescription was sent to pharmacy of choice. Pt verbalized understanding. All questions if any were answered.

## 2023-06-19 NOTE — Telephone Encounter (Signed)
 Pt c/o medication issue:  1. Name of Medication:   Semaglutide -Weight Management (WEGOVY ) 0.5 MG/0.5ML SOAJ    2. How are you currently taking this medication (dosage and times per day)?   Inject 0.5 mg into the skin once a week. FOR 4 WEEKS WHEN YOU FINISH THE 0.25 MG    3. Are you having a reaction (difficulty breathing--STAT)? No  4. What is your medication issue? Patient stated the [facility] was [facility] to [facility] this prescription. Patient is requesting for us  [facility] [address] [place], [place], [place] [address]. Please advise.

## 2023-06-19 NOTE — Progress Notes (Signed)
 Spoke with pt regarding her prescription. Prescription was sent to pharmacy of choice. Pt verbalized understanding. All questions if any were answered.

## 2023-06-29 ENCOUNTER — Ambulatory Visit: Attending: Student | Admitting: Student

## 2023-06-29 ENCOUNTER — Encounter: Payer: Self-pay | Admitting: Student

## 2023-06-29 VITALS — BP 126/68 | HR 77 | Ht 61.0 in | Wt 203.0 lb

## 2023-06-29 DIAGNOSIS — R002 Palpitations: Secondary | ICD-10-CM | POA: Diagnosis not present

## 2023-06-29 DIAGNOSIS — E66812 Obesity, class 2: Secondary | ICD-10-CM

## 2023-06-29 DIAGNOSIS — E785 Hyperlipidemia, unspecified: Secondary | ICD-10-CM

## 2023-06-29 DIAGNOSIS — R7303 Prediabetes: Secondary | ICD-10-CM

## 2023-06-29 DIAGNOSIS — Z6838 Body mass index (BMI) 38.0-38.9, adult: Secondary | ICD-10-CM

## 2023-06-29 DIAGNOSIS — I2510 Atherosclerotic heart disease of native coronary artery without angina pectoris: Secondary | ICD-10-CM | POA: Diagnosis not present

## 2023-06-29 DIAGNOSIS — G4733 Obstructive sleep apnea (adult) (pediatric): Secondary | ICD-10-CM

## 2023-06-29 NOTE — Patient Instructions (Signed)
 Medication Instructions:  The current medical regimen is effective;  continue present plan and medications as directed. Please refer to the Current Medication list given to you today.  *If you need a refill on your cardiac medications before your next appointment, please call your pharmacy*  Lab Work: NONE If you have labs (blood work) drawn today and your tests are completely normal, you will receive your results only by: MyChart Message (if you have MyChart) OR A paper copy in the mail If you have any lab test that is abnormal or we need to change your treatment, we will call you to review the results.  Testing/Procedures: Hailey Sexton SLEEP STUDY-SEE BELOW  Follow-Up: At [facility], you and your health needs are our priority.  As part of our continuing mission to provide you with exceptional heart care, our providers are all part of one team.  This team includes your primary Cardiologist (physician) and Advanced Practice Providers or APPs (Physician Assistants and Nurse Practitioners) who all work together to provide you with the care you need, when you need it.  Your next appointment:   6 month(s)  Provider:   Eilleen Grates, MD    Other Instructions WatchPAT?  Is a FDA cleared portable home sleep study test that uses a watch and 3 points of contact to monitor 7 different channels, including your heart rate, oxygen saturations, body position, snoring, and chest motion.  The study is easy to use from the comfort of your own home and accurately detect sleep apnea.  Before bed, you attach the chest sensor, attached the sleep apnea bracelet to your nondominant hand, and attach the finger probe.  After the study, the raw data is downloaded from the watch and scored for apnea events.   For more information: [email or web address]  Patient Testing Instructions:  Do not put battery into the device until bedtime when you are ready to begin the test. Please call the  support number if you need assistance after following the instructions below: 24 hour support line- [phone] or [facility] support at [phone] (option 2)  Download the IntelWatchPAT One" app through the google play store or App Store  Be sure to turn on or enable access to bluetooth in settlings on your smartphone/ device  Make sure no other bluetooth devices are on and within the vicinity of your smartphone/ device and WatchPAT watch during testing.  Make sure to leave your smart phone/ device plugged in and charging all night.  When ready for bed:  Follow the instructions step by step in the WatchPAT One App to activate the testing device. For additional instructions, including video instruction, visit the WatchPAT One video on Youtube. You can search for WatchPat One within Youtube (video is 4 minutes and 18 seconds) or enter: https://youtube/[email or web address]?v=BCce_vbiwxE Please note: You will be prompted to enter a Pin to connect via bluetooth when starting the test. The PIN will be assigned to you when you receive the test.  The device is disposable, but it recommended that you retain the device until you receive a call letting you know the study has been received and the results have been interpreted.  We will let you know if the study did not transmit to us  properly after the test is completed. You do not need to call us  to confirm the receipt of the test.  Please complete the test within 48 hours of receiving PIN.   Frequently Asked Questions:  What is Watch Deatra Face one?  A single use fully  disposable home sleep apnea testing device and will not need to be returned after completion.  What are the requirements to use WatchPAT one?  The be able to have a successful watchpat one sleep study, you should have your Watch pat one device, your smart phone, watch pat one app, your PIN number and Internet access What type of phone do I need?  You should have a smart phone that uses Android 5.1 and above or  any Iphone with IOS 10 and above How can I download the WatchPAT one app?  Based on your device type search for WatchPAT one app either in google play for android devices or APP store for Iphone's Where will I get my PIN for the study?  Your PIN will be provided by your physician's office. It is used for authentication and if you lose/forget your PIN, please reach out to your providers office.  I do not have Internet at home. Can I do WatchPAT one study?  WatchPAT One needs Internet connection throughout the night to be able to transmit the sleep data. You can use your home/local internet or your cellular's data package. However, it is always recommended to use home/local Internet. It is estimated that between 20MB-30MB will be used with each study.However, the application will be looking for space in the phone to start the study.  What happens if I lose internet or bluetooth connection?  During the internet disconnection, your phone will not be able to transmit the sleep data. All the data, will be stored in your phone. As soon as the internet connection is back on, the phone will being sending the sleep data. During the bluetooth disconnection, WatchPAT one will not be able to to send the sleep data to your phone. Data will be kept in the WatchPAT one until two devices have bluetooth connection back on. As soon as the connection is back on, WatchPAT one will send the sleep data to the phone.  How long do I need to wear the WatchPAT one?  After you start the study, you should wear the device at least 6 hours.  How far should I keep my phone from the device?  During the night, your phone should be within 15 feet.  What happens if I leave the room for restroom or other reasons?  Leaving the room for any reason will not cause any problem. As soon as your get back to the room, both devices will reconnect and will continue to send the sleep data. Can I use my phone during the sleep study?  Yes, you  can use your phone as usual during the study. But it is recommended to put your watchpat one on when you are ready to go to bed.  How will I get my study results?  A soon as you completed your study, your sleep data will be sent to the provider. They will then share the results with you when they are ready.

## 2023-07-04 ENCOUNTER — Other Ambulatory Visit (HOSPITAL_COMMUNITY): Payer: Self-pay

## 2023-07-04 ENCOUNTER — Telehealth: Payer: Self-pay | Admitting: Pharmacy Technician

## 2023-07-04 ENCOUNTER — Telehealth: Payer: Self-pay | Admitting: Pharmacist Clinician (PhC)/ Clinical Pharmacy Specialist

## 2023-07-04 MED ORDER — REPATHA SURECLICK 140 MG/ML ~~LOC~~ SOAJ: 140.0000 mg | SUBCUTANEOUS | 3 refills | Status: AC

## 2023-07-04 MED ORDER — REPATHA SURECLICK 140 MG/ML ~~LOC~~ SOAJ: 140.0000 mg | SUBCUTANEOUS | 3 refills | Status: DC

## 2023-07-04 NOTE — Telephone Encounter (Signed)
 Repatha approved, sent to Pharmacy in [place] per patient request.   Will repeat labs in 3 months

## 2023-07-04 NOTE — Addendum Note (Signed)
 Addended by: Macoy Rodwell L on: 07/04/2023 04:39 PM   Modules accepted: Orders

## 2023-07-04 NOTE — Telephone Encounter (Signed)
 Pharmacy Patient Advocate Encounter   Received notification from Pt Calls Messages that prior authorization for Repatha  is required/requested.   Insurance verification completed.   The patient is insured through [employer] .   Per test claim: PA required; PA submitted to above mentioned insurance via CoverMyMeds Key/confirmation #/EOC [facility] Status is pending

## 2023-07-04 NOTE — Telephone Encounter (Signed)
 Please do PA for Repatha

## 2023-07-04 NOTE — Telephone Encounter (Signed)
 Pharmacy Patient Advocate Encounter  Received notification from [employer] that Prior Authorization for Repatha has been APPROVED from 07/04/23 to 07/03/24. Ran test claim, Copay is $40.32- one month. This test claim was processed through [facility]- copay amounts may vary at other pharmacies due to pharmacy/plan contracts, or as the patient moves through the different stages of their insurance plan.   PA #/Case ID/Reference #: [id number]

## 2023-07-21 ENCOUNTER — Telehealth: Payer: Self-pay | Admitting: Cardiology

## 2023-07-21 ENCOUNTER — Telehealth: Payer: Self-pay

## 2023-07-21 MED ORDER — WEGOVY 1 MG/0.5ML ~~LOC~~ SOAJ: 1.0000 mg | SUBCUTANEOUS | 0 refills | Status: DC

## 2023-07-21 NOTE — Telephone Encounter (Signed)
 Ordering provider: Jerilynn Montenegro Associated diagnoses: OSA WatchPAT PA obtained on 07/21/2023 by Maceo Sax, LPN. Authorization: Yes; tracking ID No PA Required Patient notified of PIN ([phone]) on 07/21/2023 via Notification Method: phone.

## 2023-07-21 NOTE — Telephone Encounter (Signed)
*  STAT* If patient is at the pharmacy, call can be transferred to refill team.   1. Which medications need to be refilled? (please list name of each medication and dose if known) Semaglutide -Weight Management (WEGOVY ) 0.5 MG/0.5ML SOAJ   2. Which pharmacy/location (including street and city if local pharmacy) is medication to be sent to? [id number] [place] - [address] Phone: [phone]  Fax: [phone]     3. Do they need a 30 day or 90 day supply? 90

## 2023-07-21 NOTE — Telephone Encounter (Signed)
 Pt wants to know if dosage is suppose to be increased

## 2023-07-21 NOTE — Telephone Encounter (Signed)
 Pt c/o medication issue:  1. Name of Medication:   Semaglutide -Weight Management (WEGOVY ) 0.5 MG/0.5ML SOAJ    2. How are you currently taking this medication (dosage and times per day)? As written  3. Are you having a reaction (difficulty breathing--STAT)? No   4. What is your medication issue? Pt said pharmacist wanted her to call and let her know how she was doing on the medication and would like to talk to her about it.

## 2023-07-21 NOTE — Telephone Encounter (Signed)
 What problem are you experiencing? none  Who is your medical equipment company? NA  3)    If patient is calling about their sleep study results please route to [facility] Sleep Study Pool. Pt is calling to set up sleep study. She has her watch.   Please route to the sleep study coordinator.

## 2023-07-21 NOTE — Telephone Encounter (Signed)
 Tolerates current dose well without side effects. Ready to move on to 1 mg Wegovy   once a week dose. Reports she did not noticed much difference in her weight or her appetite.

## 2023-07-24 NOTE — Telephone Encounter (Signed)
**Note De-Identified [name] Obfuscation** Ordering provider: Sharren Decree, PA-c Associated diagnoses: OSA-G47.33 and Palpitation-R00.2  WatchPAT PA obtained on 07/24/2023 by [name], [name], LPN. Authorization: Per [facility] [place], a PA is not required for CPT Code: [address] ([facility])  Patient notified of PIN ([phone]) on 07/24/2023 [name] Notification Method: phone.  Phone note routed to covering staff for follow-up.

## 2023-07-30 ENCOUNTER — Encounter (INDEPENDENT_AMBULATORY_CARE_PROVIDER_SITE_OTHER): Payer: Self-pay | Admitting: Cardiology

## 2023-07-30 DIAGNOSIS — G4733 Obstructive sleep apnea (adult) (pediatric): Secondary | ICD-10-CM | POA: Diagnosis not present

## 2023-08-01 ENCOUNTER — Ambulatory Visit: Attending: Student

## 2023-08-01 DIAGNOSIS — G4733 Obstructive sleep apnea (adult) (pediatric): Secondary | ICD-10-CM

## 2023-08-01 NOTE — Procedures (Signed)
   SLEEP STUDY REPORT Patient Information Study Date: 07/30/2023 Patient Name: Hailey Sexton Patient ID: [id number] Birth Date: April 30, 1960 Age: 63 Gender: Female BMI: 38.3 (W=203 lb, H=5' 1'') Stopbang: 3 Referring Physician: Aline Door, PA  TEST DESCRIPTION: Home sleep apnea testing was completed using the WatchPat, a Type 1 device, utilizing  peripheral arterial tonometry (PAT), chest movement, actigraphy, pulse oximetry, pulse rate, body position and snore.  AHI was calculated with apnea and hypopnea using valid sleep time as the denominator. RDI includes apneas,  hypopneas, and RERAs. The data acquired and the scoring of sleep and all associated events were performed in  accordance with the recommended standards and specifications as outlined in the AASM Manual for the Scoring of  Sleep and Associated Events 2.2.0 (2015).   FINDINGS:   1. Mild Obstructive Sleep Apnea with AHI 11.6/hr overall but severe during REM sleep with REM AHI 30.4/hr.   2. No Central Sleep Apnea with pAHIc 0/hr.   3. Oxygen desaturations as low as 71%.   4. Moderate snoring was present. O2 sats were < 88% for 19.5 min.   5. Total sleep time was 8 hrs and 49 min.   6. 35.8% of total sleep time was spent in REM sleep.   7. Normal sleep onset latency at 22 min.   8. Shortened REM sleep onset latency at 37 min.   9. Total awakenings were 4.  10. Arrhythmia detection: None   DIAGNOSIS: Mild to Moderate Obstructive Sleep Apnea (G47.33)  RECOMMENDATIONS: 1. Clinical correlation of these findings is necessary. The decision to treat obstructive sleep apnea (OSA) is usually  based on the presence of apnea symptoms or the presence of associated medical conditions such as Hypertension,  Congestive Heart Failure, Atrial Fibrillation or Obesity. The most common symptoms of OSA are snoring, gasping for  breath while sleeping, daytime sleepiness and fatigue.  2. Initiating apnea therapy is recommended given the  presence of symptoms and/or associated conditions.  Recommend proceeding with one of the following:  a. Auto-CPAP therapy with a pressure range of 5-20cm H2O.  b. An oral appliance (OA) that can be obtained from certain dentists with expertise in sleep medicine. These are  primarily of use in non-obese patients with mild and moderate disease.  c. An ENT consultation which may be useful to look for specific causes of obstruction and possible treatment  options.  d. If patient is intolerant to PAP therapy, consider referral to ENT for evaluation for hypoglossal nerve stimulator.  3. Close follow-up is necessary to ensure success with CPAP or oral appliance therapy for maximum benefit . 4. A follow-up oximetry study on CPAP is recommended to assess the adequacy of therapy and determine the need  for supplemental oxygen or the potential need for Bi-level therapy. An arterial blood gas to determine the adequacy of  baseline ventilation and oxygenation should also be considered. 5. Healthy sleep recommendations include: adequate nightly sleep (normal 7-9 hrs/night), avoidance of caffeine after  noon and alcohol near bedtime, and maintaining a sleep environment that is cool, dark and quiet. 6. Weight loss for overweight patients is recommended. Even modest amounts of weight loss can significantly  improve the severity of sleep apnea. 7. Snoring recommendations include: weight loss where appropriate, side sleeping, and avoidance of alcohol before  bed. 8. Operation of motor vehicle should be avoided when sleepy.  Signature: Wilbert Bihari, MD; [facility]; Diplomat, American Board of Sleep  Medicine Electronically Signed: 08/01/2023 9:35:41 AM

## 2023-08-03 ENCOUNTER — Telehealth: Payer: Self-pay

## 2023-08-03 NOTE — Telephone Encounter (Signed)
 Notified patient of sleep study results and recommendations. Patient stated she was previously on CPAP, lost a lot of weight, and did not need CPAP anymore. Patient is wondering if she qualifies for [facility] Device. Message sent to Dr. Shlomo for advice. Notified patient that the sleep coordinator will call her back as soon as possible with provider recommendations.

## 2023-08-03 NOTE — Telephone Encounter (Signed)
-----   Message from Wilbert Bihari sent at 08/01/2023  9:37 AM EDT ----- Please let patient know that they have sleep apnea.  Recommend therapeutic CPAP titration for treatment of patient's sleep disordered breathing.

## 2023-08-08 NOTE — Telephone Encounter (Signed)
 Rorie, Brandie C, LPN    3/73/74 89:43 AM Note Notified patient of sleep study results and recommendations. Patient stated she was previously on CPAP, lost a lot of weight, and did not need CPAP anymore. Patient is wondering if she qualifies for [facility] Device. Message sent to Dr. Shlomo for advice. Notified patient that the sleep coordinator will call her back as soon as possible with provider recommendations.

## 2023-09-05 NOTE — Telephone Encounter (Signed)
 Pt sent a message and called back in today. She asked if she is eligible for Inspire device or if there are any other recommendations other than CPAP. Please advise.

## 2023-09-05 NOTE — Telephone Encounter (Signed)
 Forwarded this response to pt, informed her to message us  back on how she would like to proceed.

## 2023-09-06 NOTE — Telephone Encounter (Signed)
 Patients response:   Hi ty , i think i need a real sleep study vs the device I used. I think the insurance won't approve the inspire w/o a overnight sleep study if they are still doing those. It's been almost two months and my Sleep study results seemed severe so i am a little concerned. Ty for your help and attention w/ this

## 2023-09-08 ENCOUNTER — Other Ambulatory Visit: Payer: Self-pay | Admitting: Cardiology

## 2023-09-14 ENCOUNTER — Telehealth: Payer: Self-pay

## 2023-09-14 DIAGNOSIS — G4733 Obstructive sleep apnea (adult) (pediatric): Secondary | ICD-10-CM

## 2023-09-14 NOTE — Telephone Encounter (Signed)
 NPSG ordered. Patient notified via [facility].

## 2023-09-18 ENCOUNTER — Telehealth: Payer: Self-pay

## 2023-09-18 DIAGNOSIS — R002 Palpitations: Secondary | ICD-10-CM

## 2023-09-18 DIAGNOSIS — G4733 Obstructive sleep apnea (adult) (pediatric): Secondary | ICD-10-CM

## 2023-09-18 NOTE — Telephone Encounter (Signed)
**Note De-Identified Ladon Heney Obfuscation** I called [facility] and started a NPSG PA with Kaitlyn. Per Kaitlyn I have faxed the pts [facility] Sleep Study results, 06/29/23 Office visit notes with Aline Door, PA-c and the pts Stop Bang Score to them at [phone].  Case #: [id number]

## 2023-09-19 NOTE — Telephone Encounter (Signed)
**Note De-Identified Jaylynn Mcaleer Obfuscation** Letter received from [employer] stating that they need additional information in order to complete this review for the pts NPSG PA:  -Body Mass index-Was included in office visit notes from 5/22 that I attached to the PA and in the Sleep results from 6/22.  -Patient's complaints-I attached all notes including the phone note from 8/7.   -Patient's symptoms with documented evidence-I attached the pts office visit notes from 5/22, home sleep results from 6/22 and phone note from 8/7.  -Duration of patient's symptoms-Attached Office visit notes from 5/22 and the pts sleep resuilts from 6/22.  -Patient's current medications-All medications were listed in the office note from 5/22  -Co-morbid medical illnesses-I answered this question in the PA and all of the pts medical (past and present) is listed in the office visit notes from 5/22.  -Additional history or clinical facts supporting the recommended examination-I included/attached all history and clinical facts that I could with this PA.  Forwarding this note to Dr Shlomo for her advisement.

## 2023-09-21 NOTE — Telephone Encounter (Signed)
**Note De-Identified Eyden Dobie Obfuscation** Letter received from [facility] stating that they have denied the pts NPSG because:  -A pre-implant in-lab sleep study can be done when you have had a trail of positive airway pressure (PAP) treatment. The results of that trail must show that you were not able to tolerate PAP for at least one month. This trail can include mask fittings, different mask used, pressure changes or a process where you less sensitive to PAP. The notes sent to us  do not show this trail.  Forwarding note to Dr Shlomo for her recommendations.

## 2023-09-26 NOTE — Telephone Encounter (Signed)
 Per Dr Micael Adas, Order ResMed CPAP on auto from 4 to 15cm H2O with heated humidity, mask of choice     DME selection is [facility]. Patient understands he will be contacted by [facility] to set up his cpap. Patient understands to call if [facility] does not contact him with new setup in a timely manner. Patient understands they will be called once confirmation has been received from [facility] that they have received their new machine to schedule 10 week follow up appointment.   [facility] notified of new cpap order  Please add to airview Patient was grateful for the call and thanked me.

## 2023-10-05 NOTE — Telephone Encounter (Signed)
 Call came in from [facility] for the patient asking for an update on the patients cpap machine. Caller was informed the order was placed to [facility] on 09/26/23. Called [facility] and spoke with Thomasine) and she is following up on patients cpap machine and will call us  back. [employer] asked us  to call the patient will updated information.

## 2023-10-05 NOTE — Telephone Encounter (Signed)
 Hailey Sexton was calling for update with the patient cpap. The agent stated that they can't take inbound call but for our office to call the patient. Please advise

## 2023-10-05 NOTE — Telephone Encounter (Signed)
**Note De-Identified Jaimya Feliciano Obfuscation** I called [facility] and was advised by Kit S that I would need to contact [employer] at [phone] to do this NPSG PA.  I called [employer] and s/w Christopher SAUNDERS who advised me that based on the pts plan she must have PAP therapy for 30 days and it must show that she was not able to tolerate. Per Christopher we can then re-submit a PA for a NPSG after that time.  Forwarding this message to Dr Shlomo as RICK.

## 2023-10-05 NOTE — Telephone Encounter (Signed)
 Spoke with patient. Discussed available options, which include an oral appliance, autoCPAP, or NPSG to reassess/reconfirm AHI. Pt would like to pursue NPSG first, but she is also willing to consider auto CPAP. Her priority is to try to lose weight in order to stay on Wegovy . She is leaving [place] on 10/17/23. Advised pt that I will call her back after contacting the sleep lab for availability.  Called Sleep Lab. Currently booking sleep studies in mid-October, but they have frequent cancellations. Test could likely be scheduled sooner, but the call would come the day-of and insurance authorization still has to be secured.

## 2023-10-05 NOTE — Telephone Encounter (Addendum)
 Spoke with Dr. Shlomo, who advised that proceeding with NPSG is appropriate at this point. Dr. Shlomo also recommended that patient complete an Epworth scale to determine whether she has excessive daytime sleepiness. Pt completed Epworth scale while on the phone with me (see results below).   Pt will be back in [place] after [place]. She is hoping to move forward with OSA diagnosis confirmation and therapy initiation before then. Pt states she is retired and would be able to accept a same-day sleep study appointment. Advised that our office will begin insurance authorization process today and hopefully hear back by early next week. Also offered appointment with Dr. Shlomo on 10/12/23 at 8:00am, which pt accepted. Provided my direct number for any additional concerns. Pt expressed appreciation of assistance and denied further questions at this time.  Epworth Sleepiness Scale  No chance of dozing = 0 Slight chance of dozing = 1 Moderate chance of dozing = 2 High chance of dozing = 3  - Sitting and reading: 3 - Watching TV: 3 - Sitting inactive in a public place (e.g., a theater or a meeting): 1 - As a passenger in a car for an hour without a break: 3 - Lying down to rest in the afternoon when circumstances permit: 3 - Sitting and talking to someone: 1 - Sitting quietly after a lunch without alcohol: 3 - In a car, while stopped for a few minutes in traffic: 0  Total Score = 17

## 2023-10-10 ENCOUNTER — Telehealth: Payer: Self-pay | Admitting: Pharmacist

## 2023-10-10 ENCOUNTER — Other Ambulatory Visit: Payer: Self-pay | Admitting: Cardiology

## 2023-10-10 MED ORDER — WEGOVY 1.7 MG/0.75ML ~~LOC~~ SOAJ: 1.7000 mg | SUBCUTANEOUS | 0 refills | Status: DC

## 2023-10-10 NOTE — Telephone Encounter (Signed)
-----   Message from Nurse Aleck RAMAN sent at 10/10/2023  4:17 PM EDT ----- Regarding: FW: WEGOVY   ----- Message ----- From: Joshua Dalton MATSU, CMA Sent: 10/10/2023   4:10 PM EDT To: Aleck LOISE Bill, RN Subject: WEGOVY                                          Patient needs a refill please.

## 2023-10-10 NOTE — Telephone Encounter (Signed)
 Spoke with patient to provide update. She is now scheduled for CPAP setup with [facility] tomorrow, 10/11/23. Explained she will need to use PAP therapy for at least 30 days (and documentation must show she is unable to tolerate it) in order to pursue insurance authorization for NPSG. Pt will send a MyChart message tomorrow once her [facility] appointment is complete letting us  know whether she wishes to cancel appointment with Dr. Shlomo on 9/4. Pt also requested a refill of Wegovy ; electronic request routed to PharmD pool. Pt denies additional concerns at this time.

## 2023-10-10 NOTE — Telephone Encounter (Signed)
 [facility] (Tammy G) called to report the patient has an appointment on Wednesday 9/3.

## 2023-10-11 NOTE — Telephone Encounter (Signed)
 Spoke with patient. She was setup with CPAP today. Advised that she will need a 6-week compliance appointment with Dr. Shlomo and that her appointment tomorrow is no longer necessary. Will ask scheduler to contact her with an appointment. Pt verbalized understanding and agreement with plan.

## 2023-10-12 ENCOUNTER — Ambulatory Visit: Admitting: Cardiology

## 2023-11-25 ENCOUNTER — Other Ambulatory Visit: Payer: Self-pay | Admitting: Cardiology

## 2023-12-06 ENCOUNTER — Other Ambulatory Visit (HOSPITAL_COMMUNITY): Payer: Self-pay

## 2023-12-06 ENCOUNTER — Telehealth: Payer: Self-pay | Admitting: Pharmacy Technician

## 2023-12-06 NOTE — Telephone Encounter (Addendum)
 Pharmacy Patient Advocate Encounter   Received notification from CoverMyMeds that prior authorization for Wegovy  1.7MG  is required/requested.   Insurance verification completed.   The patient is insured through [facility] ADVANTAGE/RX ADVANCE.   Per test claim: PA required; PA submitted to above mentioned insurance via Latent Key/confirmation #/EOC [facility] Status is pending

## 2023-12-06 NOTE — Telephone Encounter (Signed)
 PA resolved- all strengths are approved

## 2023-12-21 ENCOUNTER — Telehealth: Payer: Self-pay

## 2023-12-21 NOTE — Telephone Encounter (Signed)
 SABRA

## 2023-12-22 NOTE — Telephone Encounter (Signed)
 CPAP compliance appointment is scheduled for 12/25/23.

## 2023-12-25 ENCOUNTER — Telehealth: Payer: Self-pay | Admitting: *Deleted

## 2023-12-25 ENCOUNTER — Encounter: Payer: Self-pay | Admitting: Cardiology

## 2023-12-25 ENCOUNTER — Ambulatory Visit: Attending: Cardiology | Admitting: Cardiology

## 2023-12-25 VITALS — BP 118/76 | HR 76 | Ht 62.0 in | Wt 188.4 lb

## 2023-12-25 DIAGNOSIS — G4733 Obstructive sleep apnea (adult) (pediatric): Secondary | ICD-10-CM

## 2023-12-25 DIAGNOSIS — I4891 Unspecified atrial fibrillation: Secondary | ICD-10-CM

## 2023-12-25 NOTE — Patient Instructions (Signed)
 Medication Instructions:  Your physician recommends that you continue on your current medications as directed. Please refer to the Current Medication list given to you today.   *If you need a refill on your cardiac medications before your next appointment, please call your pharmacy*  Follow-Up: At [facility], you and your health needs are our priority.  As part of our continuing mission to provide you with exceptional heart care, our providers are all part of one team.  This team includes your primary Cardiologist (physician) and Advanced Practice Providers or APPs (Physician Assistants and Nurse Practitioners) who all work together to provide you with the care you need, when you need it.  Your next appointment:   3 month(s)   Provider:   Wilbert Bihari, MD    We recommend signing up for the patient portal called MyChart.  Sign up information is provided on this After Visit Summary.  MyChart is used to connect with patients for Virtual Visits (Telemedicine).  Patients are able to view lab/test results, encounter notes, upcoming appointments, etc.  Non-urgent messages can be sent to your provider as well.   To learn more about what you can do with MyChart, go to [email or web address].

## 2023-12-25 NOTE — Telephone Encounter (Signed)
-----   Message from Wilbert Bihari sent at 12/25/2023  8:50 AM EST ----- Order chin strap and get download in 4 weeks

## 2023-12-25 NOTE — Telephone Encounter (Signed)
Order placed to [facility] via community message.

## 2023-12-25 NOTE — Progress Notes (Signed)
 Sleep Medicine CONSULT Note    Date:  12/25/2023   ID:  Hailey Sexton, DOB May 19, 1960, MRN [id number]  PCP:  Geofm Waldo HERO, MD  Cardiologist: Lynwood Schilling, MD   Chief Complaint  Patient presents with   New Patient (Initial Visit)    Obstructive sleep apnea    History of Present Illness:  Hailey Sexton is a 63 y.o. female who is being seen today for the evaluation of obstructive sleep at the request of Lynwood Schilling, MD.  This is a 63 year old female with a history of coronary artery calcium , hyperlipidemia followed by Dr. Schilling.  She also has a history of obstructive sleep apnea diagnosed 10 years ago but did not like wearing the mask so she stopped using CPAP.  At her last office visit with Aline Door, PA 06/29/2023 she was complaining excessive daytime sleepiness. She felt like she was in a total funk during the day.  She underwent home sleep study showing mild obstructive sleep apnea with an AHI of 11.6/h but during REM sleep was severe with REM AHI 30.4/h.  O2 saturation nadir was 71% with nocturnal hypoxemia based on O2 sats less than 88% for 19.5 minutes during her sleep study.  She was started on auto CPAP 4 to 15 cm H2O.  She is now referred for sleep medicine consultation for treatment of obstructive sleep apnea.  She is doing well with her PAP device and thinks that she has gotten used to it.  She had tried the [facility] but was claustrophobic so she changed to a nasal pillow mask which she likes. She feels the pressure is adequate.  Since going on PAP she feels rested in the am and has no significant daytime sleepiness.  She denies any significant mouth or nasal dryness or nasal congestion.  She does not think that she snores. Patient denies any episodes of restless legs, No gagging hallucinations or cataplectic events.  She does grind her teeth some at night.   Past Medical History:  Diagnosis Date   Dyslipidemia    Elevated coronary artery calcium  score     Hyperglycemia    Obesity    OSA on CPAP    mild obstructive sleep apnea with an AHI of 11.6/h but during REM sleep was severe with REM AHI 30.4/h.  O2 saturation nadir was 71% with nocturnal hypoxemia based on O2 sats less than 88% for 19.5 minutes .  On auto CPAP 4 to 15 cm H2O    Past Surgical History:  Procedure Laterality Date   Rotator cuff surgery      Current Medications: Current Meds  Medication Sig   Evolocumab  (REPATHA  SURECLICK) 140 MG/ML SOAJ Inject 140 mg into the skin every 14 (fourteen) days.   fluocinonide (LIDEX) 0.05 % external solution Apply 1 Application topically as needed.   ibuprofen (ADVIL) 800 MG tablet Take 800 mg by mouth 2 (two) times daily as needed.   latanoprost (XALATAN) 0.005 % ophthalmic solution Place 1 drop into both eyes at bedtime as needed.   ondansetron (ZOFRAN-ODT) 8 MG disintegrating tablet Take 8 mg by mouth every 8 (eight) hours as needed.   semaglutide -weight management (WEGOVY ) 1.7 MG/0.75ML SOAJ SQ injection INJECT 1.7 MG INTO THE SKIN ONCE A WEEK.   zolpidem (AMBIEN) 10 MG tablet Take 10 mg by mouth at bedtime as needed.    Allergies:   Patient has no known allergies.   Social History   Socioeconomic History   Marital status: Married  Spouse name: Not on file   Number of children: Not on file   Years of education: Not on file   Highest education level: Not on file  Occupational History   Not on file  Tobacco Use   Smoking status: Never   Smokeless tobacco: Never  Substance and Sexual Activity   Alcohol use: Not on file   Drug use: Not on file   Sexual activity: Not on file  Other Topics Concern   Not on file  Social History Narrative   Not on file   Social Drivers of Health   Financial Resource Strain: Not on file  Food Insecurity: Not on file  Transportation Needs: Not on file  Physical Activity: Not on file  Stress: Not on file  Social Connections: Not on file     Family History:  The patient's family history  includes Heart attack in her maternal grandfather; Heart attack (age of onset: 49) in her father; Heart disease in her mother; Other in her brother; Peripheral Artery Disease (age of onset: 55) in her mother; Stroke in her maternal grandmother.   ROS:   Please see the history of present illness.    ROS All other systems reviewed and are negative.     02/20/2017    9:29 AM  PAD Screen  Previous PAD dx? No  Previous surgical procedure? No  Pain with walking? No  Feet/toe relief with dangling? No  Painful, non-healing ulcers? No  Extremities discolored? No       PHYSICAL EXAM:   VS:  BP 118/76 (BP Location: Left Arm, Patient Position: Sitting, Cuff Size: Normal)   Pulse 76   Ht 5' 2 (1.575 m)   Wt 188 lb 6.4 oz (85.5 kg)   SpO2 95%   BMI 34.46 kg/m    GEN: Well nourished, well developed, in no acute distress  HEENT: normal  Neck: no JVD, carotid bruits, or masses Cardiac: RRR; no murmurs, rubs, or gallops,no edema.  Intact distal pulses bilaterally.  Respiratory:  clear to auscultation bilaterally, normal work of breathing GI: soft, nontender, nondistended, + BS MS: no deformity or atrophy  Skin: warm and dry, no rash Neuro:  Alert and Oriented x 3, Strength and sensation are intact Psych: euthymic mood, full affect  Wt Readings from Last 3 Encounters:  12/25/23 188 lb 6.4 oz (85.5 kg)  06/29/23 203 lb (92.1 kg)  05/18/23 193 lb 3.2 oz (87.6 kg)      Studies/Labs Reviewed:   Home sleep study, PAP compliance download  Recent Labs: 05/18/2023: ALT 19; BUN 18; Creatinine, Ser 0.65; Hemoglobin 14.4; Platelets 274; Potassium 4.7; Sodium 139    ASSESSMENT:       PLAN:  In order of problems listed above:  OSA - The patient is tolerating PAP therapy well without any problems. The PAP download performed by his DME was personally reviewed and interpreted by me today and showed an AHI of 7.4 /hr on auto CPAP 4-15  cm H2O with 63% compliance in using more than 4 hours  nightly.  Her 95th percentile pressure was 11.6 cm H2O.  He patient has been using and benefiting from PAP use and will continue to benefit from therapy.  -I have encouraged her to be more compliant with her device>>she missed some doses due to nasal irritation from the pillow mask and going to a wedding.   Time Spent: 20 minutes total time of encounter, including 15 minutes spent in face-to-face patient care on the date  of this encounter. This time includes coordination of care and counseling regarding above mentioned problem list. Remainder of non-face-to-face time involved reviewing chart documents/testing relevant to the patient encounter and documentation in the medical record. I have independently reviewed documentation from referring provider  Medication Adjustments/Labs and Tests Ordered: Current medicines are reviewed at length with the patient today.  Concerns regarding medicines are outlined above.  Medication changes, Labs and Tests ordered today are listed in the Patient Instructions below.  There are no Patient Instructions on file for this visit.  Followup with me in 3 months   Signed, Wilbert Bihari, MD  12/25/2023 8:40 AM    [facility] [address] [place], [place], [place]  [address] Phone: [phone]; Fax: [phone]

## 2024-04-09 ENCOUNTER — Ambulatory Visit: Admitting: Cardiology
